# Patient Record
Sex: Female | Born: 1995 | Race: Black or African American | Hispanic: No | Marital: Single | Smoking: Never smoker
Health system: Southern US, Community
[De-identification: ages and names within clinical notes are randomized; demographics above are authoritative.]

## PROBLEM LIST (undated history)

## (undated) DIAGNOSIS — J45909 Unspecified asthma, uncomplicated: Secondary | ICD-10-CM

## (undated) DIAGNOSIS — IMO0002 Reserved for concepts with insufficient information to code with codable children: Secondary | ICD-10-CM

## (undated) DIAGNOSIS — I10 Essential (primary) hypertension: Secondary | ICD-10-CM

## (undated) DIAGNOSIS — N189 Chronic kidney disease, unspecified: Secondary | ICD-10-CM

## (undated) DIAGNOSIS — M329 Systemic lupus erythematosus, unspecified: Secondary | ICD-10-CM

## (undated) HISTORY — DX: Essential (primary) hypertension: I10

## (undated) HISTORY — PX: OTHER SURGICAL HISTORY: SHX169

## (undated) HISTORY — DX: Reserved for concepts with insufficient information to code with codable children: IMO0002

## (undated) HISTORY — DX: Systemic lupus erythematosus, unspecified: M32.9

## (undated) HISTORY — DX: Unspecified asthma, uncomplicated: J45.909

## (undated) HISTORY — DX: Chronic kidney disease, unspecified: N18.9

---
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
## 2017-03-09 HISTORY — PX: OTHER SURGICAL HISTORY: SHX169

## 2018-03-09 HISTORY — PX: KIDNEY TRANSPLANT: SHX239

## 2019-02-16 ENCOUNTER — Ambulatory Visit: Payer: Self-pay | Admitting: Family Medicine

## 2019-03-22 ENCOUNTER — Other Ambulatory Visit: Payer: Self-pay

## 2019-03-23 ENCOUNTER — Ambulatory Visit (INDEPENDENT_AMBULATORY_CARE_PROVIDER_SITE_OTHER): Payer: Medicare Other | Admitting: Family Medicine

## 2019-03-23 ENCOUNTER — Encounter: Payer: Self-pay | Admitting: Family Medicine

## 2019-03-23 VITALS — BP 136/94 | HR 88 | Temp 97.8°F | Wt 185.0 lb

## 2019-03-23 DIAGNOSIS — Z96643 Presence of artificial hip joint, bilateral: Secondary | ICD-10-CM

## 2019-03-23 DIAGNOSIS — N939 Abnormal uterine and vaginal bleeding, unspecified: Secondary | ICD-10-CM

## 2019-03-23 DIAGNOSIS — I151 Hypertension secondary to other renal disorders: Secondary | ICD-10-CM

## 2019-03-23 DIAGNOSIS — Z7689 Persons encountering health services in other specified circumstances: Secondary | ICD-10-CM

## 2019-03-23 DIAGNOSIS — J4520 Mild intermittent asthma, uncomplicated: Secondary | ICD-10-CM

## 2019-03-23 DIAGNOSIS — Z8739 Personal history of other diseases of the musculoskeletal system and connective tissue: Secondary | ICD-10-CM

## 2019-03-23 DIAGNOSIS — N2889 Other specified disorders of kidney and ureter: Secondary | ICD-10-CM

## 2019-03-23 DIAGNOSIS — M329 Systemic lupus erythematosus, unspecified: Secondary | ICD-10-CM | POA: Diagnosis not present

## 2019-03-23 DIAGNOSIS — Z940 Kidney transplant status: Secondary | ICD-10-CM

## 2019-03-23 NOTE — Patient Instructions (Signed)
Abnormal Uterine Bleeding Abnormal uterine bleeding is unusual bleeding from the uterus. It includes:  Bleeding or spotting between periods.  Bleeding after sex.  Bleeding that is heavier than normal.  Periods that last longer than usual.  Bleeding after menopause. Abnormal uterine bleeding can affect women at various stages in life, including teenagers, women in their reproductive years, pregnant women, and women who have reached menopause. Common causes of abnormal uterine bleeding include:  Pregnancy.  Growths of tissue (polyps).  A noncancerous tumor in the uterus (fibroid).  Infection.  Cancer.  Hormonal imbalances. Any type of abnormal bleeding should be evaluated by a health care provider. Many cases are minor and simple to treat, while others are more serious. Treatment will depend on the cause of the bleeding. Follow these instructions at home:  Monitor your condition for any changes.  Do not use tampons, douche, or have sex if told by your health care provider.  Change your pads often.  Get regular exams that include pelvic exams and cervical cancer screening.  Keep all follow-up visits as told by your health care provider. This is important. Contact a health care provider if:  Your bleeding lasts for more than one week.  You feel dizzy at times.  You feel nauseous or you vomit. Get help right away if:  You pass out.  Your bleeding soaks through a pad every hour.  You have abdominal pain.  You have a fever.  You become sweaty or weak.  You pass large blood clots from your vagina. Summary  Abnormal uterine bleeding is unusual bleeding from the uterus.  Any type of abnormal bleeding should be evaluated by a health care provider. Many cases are minor and simple to treat, while others are more serious.  Treatment will depend on the cause of the bleeding. This information is not intended to replace advice given to you by your health care provider.  Make sure you discuss any questions you have with your health care provider. Document Revised: 06/02/2017 Document Reviewed: 03/27/2016 Elsevier Patient Education  2020 Elsevier Inc.  Systemic Lupus Erythematosus, Adult Systemic lupus erythematosus (SLE) is a long-term (chronic) disease that can affect many parts of the body. SLE is an autoimmune disease. With this type of disease, the body's defense system (immune system) mistakenly attacks healthy tissues. This can cause damage to the skin, joints, blood vessels, brain, kidneys, lungs, heart, and other internal organs. It causes pain, irritation, and inflammation. What are the causes? The cause of this condition is not known. What increases the risk? The following factors may make you more likely to develop this condition:  Being female.  Being of Asian, Hispanic, or African-American descent.  Having a family history of the condition.  Being exposed to tobacco smoke or smoking cigarettes.  Having an infection with a virus, such as Epstein-Barr virus.  Having a history of exposure to silica dust, metals, chemicals, mold or mildew, or insecticides.  Using oral contraceptives or hormone replacement therapy. What are the signs or symptoms? This condition can affect almost any organ or system in the body. Symptoms of the condition depend on which organ or system is affected. The most common symptoms include:  Fever.  Fatigue.  Weight loss.  Muscle aches.  Joint pain.  Skin rashes, especially over the nose and cheeks (butterfly rash) and after sun exposure. Symptoms can come and go. A period of time when symptoms get worse or come back is called a flare. A period of time with no  symptoms is called a remission. How is this diagnosed? This condition is diagnosed based on:  Your symptoms.  Your medical history.  A physical exam. You may also have tests, including:  Blood tests.  Urine tests.  A chest X-ray. You may be  referred to an autoimmune disease specialist (rheumatologist). How is this treated? There is no cure for this condition, but treatment can help to control symptoms, prevent flares (keep symptoms in remission), and prevent damage to the heart, lungs, kidneys, and other organs. Treatment will depend on what symptoms you are having and what organs or systems are affected. Treatment may involve taking a combination of medicines over time. Common medicines used to treat this condition include:  Antimalarial medicines to control symptoms, prevent flares, and protect against organ damage.  Corticosteroids and NSAIDs to reduce inflammation.  Medicines to weaken your immune system (immunosuppressants).  Biologic response modifiers to reduce inflammation and damage. Follow these instructions at home: Eating and drinking  Eat a heart-healthy diet. This may include: ? Eating high-fiber foods, such as fresh fruits and vegetables, whole grains, and beans. ? Eating heart-healthy fats (omega-3 fats), such as fish, flaxseed, and flaxseed oil. ? Limiting foods that are high in saturated fat and cholesterol, such as processed and fried foods, fatty meat, and full-fat dairy. ? Limiting how much salt (sodium) you eat.  Include calcium and vitamin D in your diet. Good sources of calcium and vitamin D include: ? Low-fat dairy products such as milk, yogurt, and cheese. ? Certain fish, such as fresh or canned salmon, tuna, and sardines. ? Products that have calcium and vitamin D added to them (fortified products), such as fortified cereals or juice. Medicines  Take over-the-counter and prescription medicines only as told by your health care provider.  Do not take any medicines that contain estrogen without first checking with your health care provider. Estrogen can trigger flares and may increase your risk for blood clots. Lifestyle      Stay active, as directed by your health care provider.  Do not use  any products that contain nicotine or tobacco, such as cigarettes and e-cigarettes. If you need help quitting, ask your health care provider.  Protect your skin from the sun by applying sunblock and wearing protective hats and clothing.  Learn as much as you can about your condition and have a good support system in place. Support may come from family, friends, or a lupus support group. General instructions  Work closely with all of your health care providers to manage your condition.  Stay up to date on all vaccines as directed by your health care provider.  Keep all follow-up visits as told by your health care provider. This is important. Contact a health care provider if:  You have a fever.  Your symptoms flare.  You develop new symptoms.  You have bloody, foamy, or coffee-colored urine.  There are changes in your urination. For example, you urinate more often at night.  You think that you may be depressed or have anxiety.  You become pregnant or plan to become pregnant. Pregnancy in women with this condition is considered high risk. Get help right away if:  You have chest pain.  You have trouble breathing.  You have a seizure.  You suddenly get a very bad headache.  You suddenly develop facial or body weakness.  You cannot speak.  You cannot understand speech. These symptoms may represent a serious problem that is an emergency. Do not wait to  see if the symptoms will go away. Get medical help right away. Call your local emergency services (911 in the [place] Do not drive yourself to the hospital. Summary  Systemic lupus erythematosus (SLE) is a long-term disease that can affect many parts of the body.  SLE is an autoimmune disease. That means your body's defense system (immune system) mistakenly attacks healthy tissues.  There is no cure for this condition, but treatment can help to control symptoms, prevent flares, and prevent damage to your organs. Treatment may  involve taking a combination of medicines over time. This information is not intended to replace advice given to you by your health care provider. Make sure you discuss any questions you have with your health care provider. Document Revised: 04/08/2017 Document Reviewed: 04/02/2017 Elsevier Patient Education  2020 [employer].

## 2019-03-27 ENCOUNTER — Encounter: Payer: Self-pay | Admitting: Family Medicine

## 2019-03-27 DIAGNOSIS — N939 Abnormal uterine and vaginal bleeding, unspecified: Secondary | ICD-10-CM | POA: Insufficient documentation

## 2019-03-27 DIAGNOSIS — M3214 Glomerular disease in systemic lupus erythematosus: Secondary | ICD-10-CM | POA: Insufficient documentation

## 2019-03-27 DIAGNOSIS — M329 Systemic lupus erythematosus, unspecified: Secondary | ICD-10-CM | POA: Insufficient documentation

## 2019-03-27 DIAGNOSIS — Z940 Kidney transplant status: Secondary | ICD-10-CM | POA: Insufficient documentation

## 2019-03-27 DIAGNOSIS — Z8739 Personal history of other diseases of the musculoskeletal system and connective tissue: Secondary | ICD-10-CM | POA: Insufficient documentation

## 2019-03-27 DIAGNOSIS — J4520 Mild intermittent asthma, uncomplicated: Secondary | ICD-10-CM | POA: Insufficient documentation

## 2019-03-27 DIAGNOSIS — I151 Hypertension secondary to other renal disorders: Secondary | ICD-10-CM | POA: Insufficient documentation

## 2019-03-27 DIAGNOSIS — Z96643 Presence of artificial hip joint, bilateral: Secondary | ICD-10-CM | POA: Insufficient documentation

## 2019-03-27 NOTE — Progress Notes (Signed)
Patient presents to clinic today to f/u on chronic issues and establish care.  SUBJECTIVE: PMH: Pt is a 24 yo female with pmh sig for h/o kidney failure, lupus nephritis s/p renal txp, asthma, HTN, h/o pericarditis, and h/o AVN s/p b/l THR.  Pt previously seen in [place] at [facility].  H/o Lupus s/p renal txp: -dx'd with Lupus, June 2013 -had proteinuria at the time -s/p renal biposy x 2. -h/o renal failure -was on PD -received txp 05/10/2018 in [place] -currently on mycophenolate, prednisone, tacrolimus, Bactrim on M, W, F, vitamin D, MVI with iron -Labs ordered last wk by Nephrologist in [place], Dr. Laney Pastor.  Done at [facility]  Per pt LFTs were elevated. -Drinking 1-2 gallons of water per day -Needs a local Nephrologist.  Requesting referral to [facility] in [place] as they have a txp center.  AUB: -Menses irregular s/p txp -LMP 6 weeks ago -Only able to use progesterone only OCPs H/o menses x 85 d while taking. -Prior to moving from [place], seen by OB/Gyn.  Needed transvaginal u/s to r/u polyp. -also states was supposed to have a biopsy in February. -needs local OB/Gyn.  Asthma: -Denies recent symptoms  HTN: -2/2 renal disease -s/p txp  H/o Pericarditis: -per pt initial confusion regarding cause. -thought likely 2/2 strep pharyngitis -occurred after 1st THR  H/o AVN: -s/p b/l hip replacement -previously seen by Rheum out of state  Allergies:   Past surgical history: S/p Bilateral hip replacement Renal biopsy times 09 August 2011 in February 2017 PD cath placed x2 and removed x2 Renal transplant  Social history: Patient is single.  Pt is currently on disability.  Working part-time at [employer].  Patient denies alcohol, tobacco, drug use.  Health Maintenance: PAP --last year  Family medical history: Dad-HTN, renal cancer status post nephrectomy MGM-anxiety, HTN MGF and "men on mom's side of family" including uncles with h/o  depression and suicide.  Past Medical History:  Diagnosis Date  . Asthma   . Chronic kidney disease   . Hypertension   . Lupus [facility])     Past Surgical History:  Procedure Laterality Date  . HIP RELACEMENT  2019  . KIDNEY TRANSPLANT  2020  . PARATENIAL CATHETER    . RENAL BIOPSY Left 2013    Current Outpatient Medications on File Prior to Visit  Medication Sig Dispense Refill  . famotidine (PEPCID) 40 MG tablet Take 40 mg by mouth 2 (two) times daily.    . Iron-Folic Acid-Vit B12 (IRON FORMULA PO) Take by mouth daily.    . mycophenolate (CELLCEPT) 500 MG tablet Take 500 mg by mouth 2 (two) times daily.    . predniSONE (DELTASONE) 5 MG tablet Take 5 mg by mouth daily with breakfast.    . Sulfamethoxazole-Trimethoprim (BACTRIM PO) Take by mouth 3 (three) times a week.    . tacrolimus (PROGRAF) 5 MG capsule Take 5 mg by mouth 2 (two) times daily.    Marland Kitchen VITAMIN D PO Take 5,000 Units by mouth daily.     No current facility-administered medications on file prior to visit.    Allergies  Allergen Reactions  . Nickel     SKIN IRRITATION    History reviewed. No pertinent family history.  Social History   Socioeconomic History  . Marital status: Single    Spouse name: Not on file  . Number of children: Not on file  . Years of education: Not on file  .  Highest education level: Not on file  Occupational History  . Not on file  Tobacco Use  . Smoking status: Never Smoker  . Smokeless tobacco: Never Used  Substance and Sexual Activity  . Alcohol use: Yes    Comment: OCCASSIONAL  . Drug use: Never  . Sexual activity: Yes  Other Topics Concern  . Not on file  Social History Narrative  . Not on file   Social Determinants of Health   Financial Resource Strain:   . Difficulty of Paying Living Expenses: Not on file  Food Insecurity:   . Worried About Programme researcher, broadcasting/film/video in the Last Year: Not on file  . Ran Out of Food in the Last Year: Not on file  Transportation Needs:    . Lack of Transportation (Medical): Not on file  . Lack of Transportation (Non-Medical): Not on file  Physical Activity:   . Days of Exercise per Week: Not on file  . Minutes of Exercise per Session: Not on file  Stress:   . Feeling of Stress : Not on file  Social Connections:   . Frequency of Communication with Friends and Family: Not on file  . Frequency of Social Gatherings with Friends and Family: Not on file  . Attends Religious Services: Not on file  . Active Member of Clubs or Organizations: Not on file  . Attends Banker Meetings: Not on file  . Marital Status: Not on file  Intimate Partner Violence:   . Fear of Current or Ex-Partner: Not on file  . Emotionally Abused: Not on file  . Physically Abused: Not on file  . Sexually Abused: Not on file    ROS General: Denies fever, chills, night sweats, changes in weight, changes in appetite HEENT: Denies headaches, ear pain, changes in vision, rhinorrhea, sore throat CV: Denies CP, palpitations, SOB, orthopnea Pulm: Denies SOB, cough, wheezing GI: Denies abdominal pain, nausea, vomiting, diarrhea, constipation GU: Denies dysuria, hematuria, frequency, vaginal discharge  +AUB Msk: Denies muscle cramps, joint pains Neuro: Denies weakness, numbness, tingling Skin: Denies rashes, bruising Psych: Denies depression, anxiety, hallucinations  BP (!) 136/94 (BP Location: Left Arm, Patient Position: Sitting, Cuff Size: Large)   Pulse 88   Temp 97.8 F (36.6 C) (Temporal)   Wt 185 lb (83.9 kg)   LMP 03/30/2018 (Exact Date)   SpO2 98%   Physical Exam Gen. Pleasant, well developed, well-nourished, in NAD HEENT - [place]/AT, PERRL, EOMI, no scleral icterus, no nasal drainage, pharynx without erythema or exudate. Lungs: no use of accessory muscles, CTAB, no wheezes, rales or rhonchi Cardiovascular: RRR, No r/g/m, no peripheral edema.  No renal bruits noted. Abdomen: BS present, soft, nontender,nondistended.     Musculoskeletal: No deformities, moves all four extremities, no cyanosis or clubbing, normal tone Neuro:  A&Ox3, CN II-XII intact, normal gait Skin:  Warm, dry, intact, no lesions Psych: normal affect, mood appropriate  No results found for this or any previous visit (from the past 2160 hour(s)).  Assessment/Plan: Lupus (HCC)  -caused nephritis leading renal transplant. 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  No results available in chart. - Plan: Ambulatory referral to Nephrology  Hypertension secondary to other renal disorders -elevated likely 2/2 white coat HTN as meeting a new  provider. -discussed importance of bp control -not currently on meds -lifestyle modifications.  Monitor bp at home. -will continue to monitor  Mild intermittent asthma without complication -stable -not currently requiring meds -continue to monitor  Abnormal uterine bleeding (AUB)  -as pt needs possible biopsy will place referral to OB/Gyn - Plan: Ambulatory referral to Obstetrics / Gynecology, US Transvaginal Non-OB  Encounter to establish care -We reviewed the PMH, PSH, FH, SH, Meds and Allergies. -We provided refills for any medications we will prescribe as needed. -We addressed current concerns per orders and patient instructions. -We have asked for records for pertinent exams, studies, vaccines and notes from previous providers. -We have advised patient to follow up per instructions below.  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

## 2019-04-05 ENCOUNTER — Ambulatory Visit
Admission: RE | Admit: 2019-04-05 | Discharge: 2019-04-05 | Disposition: A | Discharge status: Home / Self Care | Payer: Medicare Other | Source: Ambulatory Visit | Attending: Family Medicine | Admitting: Family Medicine

## 2019-04-05 DIAGNOSIS — N939 Abnormal uterine and vaginal bleeding, unspecified: Secondary | ICD-10-CM

## 2019-04-07 ENCOUNTER — Telehealth: Payer: Self-pay | Admitting: Family Medicine

## 2019-04-07 NOTE — Telephone Encounter (Signed)
[facility] HIM Dept faxed request for medical records to [facility]  04/07/19  KLM

## 2019-04-18 ENCOUNTER — Other Ambulatory Visit: Payer: Self-pay

## 2019-04-19 ENCOUNTER — Encounter: Payer: Self-pay | Admitting: Obstetrics and Gynecology

## 2019-04-19 ENCOUNTER — Ambulatory Visit (INDEPENDENT_AMBULATORY_CARE_PROVIDER_SITE_OTHER): Payer: Medicare Other | Admitting: Obstetrics and Gynecology

## 2019-04-19 VITALS — BP 142/90 | Ht 63.0 in | Wt 185.0 lb

## 2019-04-19 DIAGNOSIS — N898 Other specified noninflammatory disorders of vagina: Secondary | ICD-10-CM

## 2019-04-19 DIAGNOSIS — Z114 Encounter for screening for human immunodeficiency virus [HIV]: Secondary | ICD-10-CM

## 2019-04-19 DIAGNOSIS — N926 Irregular menstruation, unspecified: Secondary | ICD-10-CM | POA: Diagnosis not present

## 2019-04-19 DIAGNOSIS — Z124 Encounter for screening for malignant neoplasm of cervix: Secondary | ICD-10-CM

## 2019-04-19 DIAGNOSIS — Z113 Encounter for screening for infections with a predominantly sexual mode of transmission: Secondary | ICD-10-CM

## 2019-04-19 DIAGNOSIS — Z1151 Encounter for screening for human papillomavirus (HPV): Secondary | ICD-10-CM

## 2019-04-19 LAB — WET PREP FOR TRICH, YEAST, CLUE

## 2019-04-19 LAB — PREGNANCY, URINE: Preg Test, Ur: NEGATIVE

## 2019-04-19 MED ORDER — NORETHINDRONE 0.35 MG PO TABS: 1.0000 | ORAL_TABLET | Freq: Every day | ORAL | 4 refills | Status: DC

## 2019-04-19 MED ORDER — METRONIDAZOLE 0.75 % VA GEL: 1.0000 | Freq: Every day | VAGINAL | 0 refills | Status: AC

## 2019-04-19 MED ORDER — METRONIDAZOLE 500 MG PO TABS: 500.0000 mg | ORAL_TABLET | Freq: Two times a day (BID) | ORAL | 0 refills | Status: DC

## 2019-04-19 NOTE — Progress Notes (Signed)
Wendy King  07/16/1995 [id number]  HPI The patient is a 24 y.o. G0P0000 who presents today for irregular menses.  She is new to the office, coming from [place].  She had a kidney transplant 11 months ago due to effects from lupus nephritis.  She indicates that in January 2020 her Pap smear was abnormal but says the follow-up biopsy was "normal"  (there are no Pap smear or surgical pathology results available in EMR or care everywhere to view). It appears that she has antiphospholipid antibodies present.  She indicates that she has had a lifelong history of irregular periods.  She was concerned because of development of menstrual irregularity particularly ongoing spotting around the time of her surgery and with her use of steroids.  She was therefore put on progestin only minipill for birth control to help try to regulate her menstrual cycle.  At first, she started to get periods that were very heavy in the first day or two, requiring her to use multiple changes of a super tampon and pad within an hour.  With continued use this heavy bleeding stopped happening but then she started to develop menstrual irregularity and spotting sometimes for up to a few weeks per month.  This has been frustrating for her.  She did stop taking the minipill to see what effect this would have but so far she has not resumed a normal menstrual pattern and continues to have the irregular bleeding.  Her primary care provider did order a pelvic ultrasound performed 04/05/2019 which was essentially normal.  She also notes increased vaginal discharge and has had a history of bacterial vaginosis in the past.  She also would like to have STD screening today.  She says she has not been sexually active recently.  Past medical history,surgical history, problem list, medications, allergies, family history and social history were all reviewed and documented as reviewed in the EPIC chart.  Current Outpatient Medications on  File Prior to Visit  Medication Sig Dispense Refill  . carvedilol (COREG) 25 MG tablet Take 25 mg by mouth 2 (two) times daily with a meal.    . famotidine (PEPCID) 40 MG tablet Take 40 mg by mouth 2 (two) times daily.    . Iron-Folic Acid-Vit B12 (IRON FORMULA PO) Take by mouth daily.    . mycophenolate (CELLCEPT) 500 MG tablet Take 500 mg by mouth 2 (two) times daily.    . predniSONE (DELTASONE) 5 MG tablet Take 5 mg by mouth daily with breakfast.    . Sulfamethoxazole-Trimethoprim (BACTRIM PO) Take by mouth 3 (three) times a week.    . tacrolimus (PROGRAF) 5 MG capsule Take 5 mg by mouth 2 (two) times daily.    Marland Kitchen VITAMIN D PO Take 5,000 Units by mouth daily.     No current facility-administered medications on file prior to visit.    ROS:  Feeling well. No dyspnea or chest pain on exertion.  No abdominal pain, change in bowel habits, black or bloody stools.  No urinary tract symptoms. GYN ROS: As described above in the HPI  Physical Exam  BP (!) 142/90   Ht 5\' 3"  (1.6 m)   Wt 185 lb (83.9 kg)   LMP 03/25/2019   BMI 32.77 kg/m   General: Pleasant female, no acute distress, alert and oriented PELVIC EXAM: VULVA: normal appearing vulva with no masses, tenderness or lesions,  VAGINA: normal appearing vagina with normal color, no lesions, white-green thick discharge no odor, CERVIX: posterior location,  normal appearing cervix without discharge or lesions, somewhat uncomfortable with speculum exam, UTERUS: anteverted uterus is normal size, shape, consistency and nontender, ADNEXA: normal adnexa in size, nontender and no masses  Negative urine pregnancy test Wet mount vaginal indicates positive clue cells, negative trichomonas and hyphae, moderate white blood cells, many bacteria, 12-20 epithelial cells/hpf  Caryn Bee present at the examination   Assessment 24 yo G0 with past medical history significant for lupus and is status post kidney transplant here with concerns of abnormal  uterine bleeding, bacterial vaginosis  Plan 1.  Abnormal uterine bleeding Normal pelvic ultrasound and normal exam today.  Negative pregnancy test.  2 mm endometrial thickness on the ultrasound, no need for endometrial biopsy at this point unless the irregular menstrual bleeding is ongoing and becomes heavy.  No evidence of endometrial polyp on ultrasound but this sometimes can be missed so if ongoing may need to consider sonohysterogram or hysteroscopy.  Sounds like she has a history of abnormal Pap smear possibly cervical dysplasia.  Pap smear is repeated today.  We will check prolactin and TSH.  We had a long discussion regarding expectations from different types of birth control to help address abnormal uterine bleeding.  Since she has a high thrombosis risk with antiphospholipid antibodies present, I would recommend avoiding contraception with estrogen.  With progestin only options, there is still a slight increased risk of thrombotic disease, and often times endometrial atrophy with continued use of progestin only options can lead to problems with irregular bleeding/spotting, and this is a very well known side effect of the minipill.  We discussed various progestin only options including continuing on the minipill, or trialing Depo-Provera, Nexplanon, or the hormonal IUDs such as [place] or Mirena.  Overall it would probably be safest for her to not be on any hormones, but with her AUB problem, the benefits outweigh the risks.  The safest of the hormonal options would probably be the Mirena IUD, which she initially was not very open to considering.  She would like to do some more research on the different contraceptive options offered today.  I did refill the minipill for the time being and she will let us know if she would like to further discuss the other options.  We discussed that unfortunately some of her other concerns tend to be common side effects with different types of contraception, including  breast changes/tenderness, bloating, weight gain, etc.  2.  Bacterial vaginosis. She indicates that she has had this before.  We discussed that with 4 documented recurrences in any given year, this would be considered a recurrent problem.  For now we will treat with MetroGel nightly for 5 nights and if this becomes a recurrent issue, we may need to consider suppressive therapy.  Other advice to reduce the risk of recurrent BV is given..  3.  History of abnormal Pap smear. I do not have records of her previous Pap smear or pathology results from the biopsy she says she had.  We went ahead and performed a Pap smear and HPV cotest today.  4.  Patient requested STD screening. Vaginal wet mount and GC/committee are obtained, blood test screenings for HIV, HB surface antigen, hepatitis C antibody, RPR are collected.  5.  Blood pressure mildly elevated today.  Especially with lupus, I recommend that she monitor her blood pressures and notify her primary care physician if there is any persistent elevation.  I encouraged the patient to let us know she has any questions regarding  her different contraceptive options and to return to the office for further discussion if she desires.  All of her questions were answered to her satisfaction today.   Theresia Majors MD 04/19/19

## 2019-04-19 NOTE — Patient Instructions (Addendum)
We will notify you of pap smear results and STD results Look up information on the websites for Mirena or Skyla IUD Other birth control options would include Depo Provera or Nexplanon I sent a prescription for the MetroGel to treat the bacterial vaginosis Metrorrhagia Metrorrhagia is bleeding from the uterus that happens irregularly but often. The bleeding generally happens between menstrual periods. Follow these instructions at home: Pay attention to any changes in your symptoms. Let your health care provider know about them. Follow these instructions to help with your condition: Eating and drinking   Eat well-balanced meals. Include foods that are high in iron, such as liver, meat, shellfish, green leafy vegetables, and eggs.  If you become constipated: ? Drink plenty of water. Drink enough to keep your urine pale yellow. ? Take over-the-counter or prescription medicines. ? Eat foods that are high in fiber, such as beans, whole grains, and fresh fruits and vegetables. ? Limit foods that are high in fat and processed sugars, such as fried or sweet foods. Medicines  Take over-the-counter and prescription medicines only as told by your health care provider.  Do not change medicines without talking with your health care provider.  Aspirin or medicines that contain aspirin may make the bleeding worse. Do not take these medicines: ? During your period. ? During the week before your period.  If you were prescribed iron pills, take them as told by your health care provider. Iron pills help to replace iron that your body loses because of this condition. Activity  If you need to change your sanitary pad or tampon more than one time every 2 hours: ? Lie in bed with your feet raised (elevated). ? Place a cold pack on your lower abdomen. ? Rest as much as possible until the bleeding stops or slows down. General instructions   For 2 months, write down: ? When your period starts. ? When  your period ends. ? When any abnormal bleeding occurs. ? What problems you notice.  Keep all follow-up visits as told by your health care provider. This is important. Contact a health care provider if:  You get light-headed or weak.  You have nausea and vomiting.  You cannot eat or drink without vomiting.  You feel dizzy or have diarrhea while you are taking medicine.  Have questions about birth control. Get help right away if:  You develop a fever or chills.  You need to change your sanitary pad or tampon more than one time per hour.  Your bleeding becomesheavy.  Your flow contains clots.  You develop pain in your abdomen.  You lose consciousness.  You develop a rash. Summary  Metrorrhagia is bleeding from the uterus that happens irregularly but often, usually between menstrual periods.  Pay attention to any changes in your symptoms. Let your health care provider know about them.  Eat well-balanced meals. Include foods that are high in iron, such as liver, meat, shellfish, green leafy vegetables, and eggs.  Get help right away if you develop a fever, you see clots in your blood, your bleeding becomes heavy, you develop a rash, or you lose consciousness. This information is not intended to replace advice given to you by your health care provider. Make sure you discuss any questions you have with your health care provider. Document Revised: 08/26/2017 Document Reviewed: 08/26/2017 Elsevier Patient Education  2020 [employer].

## 2019-04-20 LAB — HIV ANTIBODY (ROUTINE TESTING W REFLEX): HIV 1&2 Ab, 4th Generation: NONREACTIVE

## 2019-04-20 LAB — TSH: TSH: 1.32 mIU/L

## 2019-04-20 LAB — HEPATITIS C ANTIBODY
Hepatitis C Ab: NONREACTIVE
SIGNAL TO CUT-OFF: 0.03 (ref <1.00)

## 2019-04-20 LAB — PROLACTIN: Prolactin: 13.1 ng/mL

## 2019-04-20 LAB — HEPATITIS B SURFACE ANTIGEN: Hepatitis B Surface Ag: NONREACTIVE

## 2019-04-20 LAB — RPR: RPR Ser Ql: NONREACTIVE

## 2019-04-21 LAB — PAP IG, CT-NG NAA, HPV HIGH-RISK
C. trachomatis RNA, TMA: NOT DETECTED
HPV DNA High Risk: NOT DETECTED
N. gonorrhoeae RNA, TMA: NOT DETECTED

## 2019-05-30 ENCOUNTER — Telehealth: Payer: Self-pay | Admitting: Family Medicine

## 2019-05-30 ENCOUNTER — Other Ambulatory Visit: Payer: Self-pay

## 2019-05-30 DIAGNOSIS — M329 Systemic lupus erythematosus, unspecified: Secondary | ICD-10-CM

## 2019-05-30 NOTE — Telephone Encounter (Signed)
Pt is requesting a referral to Transplant Center at [facility].   Rheumatologist as well she has pain all over from her lupus.

## 2019-05-30 NOTE — Telephone Encounter (Signed)
Spoke with pt state that she has not heard from nephrology office, pt state that she will call [facility] for information regarding their transplant clinic, pt state that she will call the office with information on placing a referral to [facility] Transplant clinic. Pt is aware that a referral to Rheumatology has been placed and should get a phone call for scheduling.

## 2019-05-30 NOTE — Telephone Encounter (Signed)
Called pt left a detailed message advised to call office to give update on if pt ever got an appointment with the Nephrologist

## 2019-05-30 NOTE — Telephone Encounter (Signed)
Please advise if ok for referrals

## 2019-05-30 NOTE — Telephone Encounter (Signed)
Pt called back and advised that she did not get an appt with Nephrologist.

## 2019-05-30 NOTE — Telephone Encounter (Signed)
Did pt ever go to or hear back from referral to Transplant Nephrologist at [facility] as previously requested?  Ok for rheumatology referral.

## 2019-06-02 ENCOUNTER — Ambulatory Visit: Payer: Medicare Other | Attending: Internal Medicine

## 2019-06-02 DIAGNOSIS — Z23 Encounter for immunization: Secondary | ICD-10-CM

## 2019-06-02 NOTE — Progress Notes (Signed)
   Covid-19 Vaccination Clinic  Name:  Aly Seidenberg    MRN: [id number] DOB: 1996/02/25  06/02/2019  Ms. Breshears was observed post Covid-19 immunization for 15 minutes without incident. She was provided with Vaccine Information Sheet and instruction to access the V-Safe system.   Ms. Cwynar was instructed to call 911 with any severe reactions post vaccine: Marland Kitchen Difficulty breathing  . Swelling of face and throat  . A fast heartbeat  . A bad rash all over body  . Dizziness and weakness   Immunizations Administered    Name Date Dose VIS Date Route   Pfizer COVID-19 Vaccine 06/02/2019 12:56 PM 0.3 mL 02/17/2019 Intramuscular   Manufacturer: [employer], [employer]   Lot: [id number]   NDC: [address]-6

## 2019-06-05 ENCOUNTER — Other Ambulatory Visit: Payer: Self-pay | Admitting: Family Medicine

## 2019-06-05 DIAGNOSIS — M329 Systemic lupus erythematosus, unspecified: Secondary | ICD-10-CM

## 2019-06-05 DIAGNOSIS — Z940 Kidney transplant status: Secondary | ICD-10-CM

## 2019-06-05 NOTE — Telephone Encounter (Signed)
Referral to [facility] placed 

## 2019-06-06 NOTE — Telephone Encounter (Signed)
Noted  

## 2019-06-14 ENCOUNTER — Encounter: Payer: Self-pay | Admitting: Family Medicine

## 2019-06-27 ENCOUNTER — Ambulatory Visit: Payer: Medicare Other

## 2020-02-15 NOTE — Progress Notes (Signed)
Subjective:   Wendy King is a 24 y.o. female who presents for Medicare Annual (Subsequent) preventive examination.  Review of Systems    N/A  Cardiac Risk Factors include: hypertension     Objective:    Today's Vitals   02/16/20 1520  BP: (!) 148/108  Pulse: 77  Temp: 98.3 F (36.8 C)  TempSrc: Oral  SpO2: 98%  Weight: 198 lb (89.8 kg)  Height: 5\' 3"  (1.6 m)   Body mass index is 35.07 kg/m.  Advanced Directives 02/16/2020  Does Patient Have a Medical Advance Directive? No  Would patient like information on creating a medical advance directive? No - Patient declined    Current Medications (verified) Outpatient Encounter Medications as of 02/16/2020  Medication Sig  . carvedilol (COREG) 25 MG tablet Take 25 mg by mouth 2 (two) times daily with a meal.  . famotidine (PEPCID) 40 MG tablet Take 40 mg by mouth 2 (two) times daily.  . Iron-Folic Acid-Vit B12 (IRON FORMULA PO) Take by mouth daily.  . mycophenolate (CELLCEPT) 500 MG tablet Take 500 mg by mouth 2 (two) times daily.  . norethindrone (ORTHO MICRONOR) 0.35 MG tablet Take 1 tablet (0.35 mg total) by mouth daily.  . predniSONE (DELTASONE) 5 MG tablet Take 5 mg by mouth daily with breakfast.  . Sulfamethoxazole-Trimethoprim (BACTRIM PO) Take by mouth 3 (three) times a week.  . tacrolimus (PROGRAF) 5 MG capsule Take by mouth 2 (two) times daily.  14/12/2019 VITAMIN D PO Take 5,000 Units by mouth daily.   No facility-administered encounter medications on file as of 02/16/2020.    Allergies (verified) Sulfa antibiotics and Nickel   History: Past Medical History:  Diagnosis Date  . Asthma   . Chronic kidney disease   . Hypertension   . Lupus [facility])    Past Surgical History:  Procedure Laterality Date  . HIP RELACEMENT  2019  . KIDNEY TRANSPLANT  2020  . PARATENIAL CATHETER    . RENAL BIOPSY Left 2013   Family History  Problem Relation Age of Onset  . Other Mother        Drug abuse  . Kidney cancer  Father   . Other Father        Drug abuse  . Hypertension Maternal Grandmother    Social History   Socioeconomic History  . Marital status: Single    Spouse name: Not on file  . Number of children: Not on file  . Years of education: Not on file  . Highest education level: Not on file  Occupational History  . Not on file  Tobacco Use  . Smoking status: Never Smoker  . Smokeless tobacco: Never Used  Vaping Use  . Vaping Use: Never used  Substance and Sexual Activity  . Alcohol use: Yes    Comment: OCCASSIONAL  . Drug use: Never  . Sexual activity: Yes    Birth control/protection: Condom    Comment: 1st intercourse 24 yo-More than 5 partners  Other Topics Concern  . Not on file  Social History Narrative  . Not on file   Social Determinants of Health   Financial Resource Strain: Low Risk   . Difficulty of Paying Living Expenses: Not hard at all  Food Insecurity: No Food Insecurity  . Worried About 14 in the Last Year: Never true  . Ran Out of Food in the Last Year: Never true  Transportation Needs: No Transportation Needs  . Lack of Transportation (Medical): No  .  Lack of Transportation (Non-Medical): No  Physical Activity: Insufficiently Active  . Days of Exercise per Week: 4 days  . Minutes of Exercise per Session: 30 min  Stress: No Stress Concern Present  . Feeling of Stress : Not at all  Social Connections: Moderately Isolated  . Frequency of Communication with Friends and Family: More than three times a week  . Frequency of Social Gatherings with Friends and Family: More than three times a week  . Attends Religious Services: Never  . Active Member of Clubs or Organizations: Yes  . Attends Banker Meetings: Never  . Marital Status: Never married    Tobacco Counseling Counseling given: Not Answered   Clinical Intake:  Pre-visit preparation completed: Yes  Pain : No/denies pain     Nutritional Risks: Unintentional weight  gain (Medication side effects) Diabetes: No  How often do you need to have someone help you when you read instructions, pamphlets, or other written materials from your doctor or pharmacy?: 1 - Never What is the last grade level you completed in school?: 12th grade  Diabetic?No   Interpreter Needed?: No  Information entered by :: SCrews,LPN   Activities of Daily Living In your present state of health, do you have any difficulty performing the following activities: 02/16/2020  Hearing? N  Vision? N  Difficulty concentrating or making decisions? N  Walking or climbing stairs? N  Dressing or bathing? N  Doing errands, shopping? N  Preparing Food and eating ? N  Using the Toilet? N  In the past six months, have you accidently leaked urine? N  Do you have problems with loss of bowel control? N  Managing your Medications? N  Managing your Finances? N  Housekeeping or managing your Housekeeping? N  Some recent data might be hidden    Patient Care Team: Deeann Saint, MD as PCP - General (Family Medicine)  Indicate any recent Medical Services you may have received from other than [facility] providers in the past year (date may be approximate).     Assessment:   This is a routine wellness examination for Wendy King.  Hearing/Vision screen  Hearing Screening   125Hz  250Hz  500Hz  1000Hz  2000Hz  3000Hz  4000Hz  6000Hz  8000Hz   Right ear:           Left ear:           Vision Screening Comments: Patient states gets eyes checked yearly. Has sometimes has floater in left eye   Dietary issues and exercise activities discussed: Current Exercise Habits: Home exercise routine, Type of exercise: walking, Time (Minutes): 30, Frequency (Times/Week): 4, Weekly Exercise (Minutes/Week): 120, Intensity: Mild, Exercise limited by: None identified  Goals    . Exercise 150 min/wk Moderate Activity      Depression Screen PHQ 2/9 Scores 02/16/2020  PHQ - 2 Score 4  PHQ- 9 Score 7    Fall Risk Fall  Risk  02/16/2020  Falls in the past year? 0  Number falls in past yr: 0  Injury with Fall? 0  Risk for fall due to : No Fall Risks  Follow up Falls evaluation completed;Falls prevention discussed    FALL RISK PREVENTION PERTAINING TO THE HOME:  Any stairs in or around the home? No  If so, are there any without handrails? No  Home free of loose throw rugs in walkways, pet beds, electrical cords, etc? Yes  Adequate lighting in your home to reduce risk of falls? Yes   ASSISTIVE DEVICES UTILIZED TO PREVENT FALLS:  Life alert? No  Use of a cane, walker or w/c? No  Grab bars in the bathroom? No  Shower chair or bench in shower? No  Elevated toilet seat or a handicapped toilet? No   TIMED UP AND GO:  Was the test performed? Yes .  Length of time to ambulate 10 feet: 3 sec.   Gait steady and fast without use of assistive device  Cognitive Function:   Normal cognitive status assessed by direct observation by this Nurse Health Advisor. No abnormalities found.        Immunizations Immunization History  Administered Date(s) Administered  . Influenza-Unspecified 01/08/2020  . PFIZER SARS-COV-2 Vaccination 06/02/2019, 07/08/2019, 12/01/2019    TDAP status: Due, Education has been provided regarding the importance of this vaccine. Advised may receive this vaccine at local pharmacy or Health Dept. Aware to provide a copy of the vaccination record if obtained from local pharmacy or Health Dept. Verbalized acceptance and understanding.  Flu Vaccine status: Up to date  Pneumococcal vaccine status: Up to date  Covid-19 vaccine status: Completed vaccines  Qualifies for Shingles Vaccine? No   Zostavax completed No   Shingrix Completed?: No.    Education has been provided regarding the importance of this vaccine. Patient has been advised to call insurance company to determine out of pocket expense if they have not yet received this vaccine. Advised may also receive vaccine at local  pharmacy or Health Dept. Verbalized acceptance and understanding.  Screening Tests Health Maintenance  Topic Date Due  . TETANUS/TDAP  Never done  . COVID-19 Vaccine (4 - Booster for Pfizer series) 05/30/2020  . PAP-Cervical Cytology Screening  04/18/2022  . PAP SMEAR-Modifier  04/18/2022  . INFLUENZA VACCINE  Completed  . Hepatitis C Screening  Completed  . HIV Screening  Completed    Health Maintenance  Health Maintenance Due  Topic Date Due  . TETANUS/TDAP  Never done    Colorectal Cancer Screening: Not required until age 77  Mammogram Status: Not required until age 10  Bone Density Status: Not required until age 101  Lung Cancer Screening: (Low Dose CT Chest recommended if Age 23-80 years, 30 pack-year currently smoking OR have quit w/in 15years.) does not qualify.   Lung Cancer Screening Referral: N/A   Additional Screening:  Hepatitis C Screening: does not qualify   Vision Screening: Recommended annual ophthalmology exams for early detection of glaucoma and other disorders of the eye. Is the patient up to date with their annual eye exam?  Yes  Who is the provider or what is the name of the office in which the patient attends annual eye exams? Dr.Peter Dunn If pt is not established with a provider, would they like to be referred to a provider to establish care? No .   Dental Screening: Recommended annual dental exams for proper oral hygiene  Community Resource Referral / Chronic Care Management: CRR required this visit?  No   CCM required this visit?  No      Plan:     I have personally reviewed and noted the following in the patient's chart:   . Medical and social history . Use of alcohol, tobacco or illicit drugs  . Current medications and supplements . Functional ability and status . Nutritional status . Physical activity . Advanced directives . List of other physicians . Hospitalizations, surgeries, and ER visits in previous 12  months . Vitals . Screenings to include cognitive, depression, and falls . Referrals and appointments  In addition,  I have reviewed and discussed with patient certain preventive protocols, quality metrics, and best practice recommendations. A written personalized care plan for preventive services as well as general preventive health recommendations were provided to patient.     Theodora BlowShannon Oluwaseun Cremer, LPN   16/10/960412/12/2019   Nurse Notes: None

## 2020-02-16 ENCOUNTER — Ambulatory Visit (INDEPENDENT_AMBULATORY_CARE_PROVIDER_SITE_OTHER): Payer: Medicare Other

## 2020-02-16 ENCOUNTER — Ambulatory Visit (INDEPENDENT_AMBULATORY_CARE_PROVIDER_SITE_OTHER): Payer: Medicare Other | Admitting: Family Medicine

## 2020-02-16 ENCOUNTER — Other Ambulatory Visit: Payer: Self-pay

## 2020-02-16 ENCOUNTER — Encounter: Payer: Self-pay | Admitting: Family Medicine

## 2020-02-16 VITALS — BP 148/108 | HR 77 | Temp 98.3°F | Ht 63.0 in | Wt 198.0 lb

## 2020-02-16 VITALS — BP 148/104 | HR 77 | Temp 98.3°F | Wt 198.0 lb

## 2020-02-16 DIAGNOSIS — I151 Hypertension secondary to other renal disorders: Secondary | ICD-10-CM

## 2020-02-16 DIAGNOSIS — F321 Major depressive disorder, single episode, moderate: Secondary | ICD-10-CM

## 2020-02-16 DIAGNOSIS — Z0000 Encounter for general adult medical examination without abnormal findings: Secondary | ICD-10-CM | POA: Diagnosis not present

## 2020-02-16 DIAGNOSIS — F419 Anxiety disorder, unspecified: Secondary | ICD-10-CM | POA: Diagnosis not present

## 2020-02-16 DIAGNOSIS — M3214 Glomerular disease in systemic lupus erythematosus: Secondary | ICD-10-CM

## 2020-02-16 DIAGNOSIS — R5383 Other fatigue: Secondary | ICD-10-CM

## 2020-02-16 DIAGNOSIS — F4310 Post-traumatic stress disorder, unspecified: Secondary | ICD-10-CM

## 2020-02-16 DIAGNOSIS — Z940 Kidney transplant status: Secondary | ICD-10-CM

## 2020-02-16 DIAGNOSIS — R41840 Attention and concentration deficit: Secondary | ICD-10-CM | POA: Diagnosis not present

## 2020-02-16 DIAGNOSIS — N2889 Other specified disorders of kidney and ureter: Secondary | ICD-10-CM

## 2020-02-16 NOTE — Patient Instructions (Addendum)
Preventive Care 21-26 Years Old, Female Preventive care refers to visits with your health care provider and lifestyle choices that can promote health and wellness. This includes:  A yearly physical exam. This may also be called an annual well check.  Regular dental visits and eye exams.  Immunizations.  Screening for certain conditions.  Healthy lifestyle choices, such as eating a healthy diet, getting regular exercise, not using drugs or products that contain nicotine and tobacco, and limiting alcohol use. What can I expect for my preventive care visit? Physical exam Your health care provider will check your:  Height and weight. This may be used to calculate body mass index (BMI), which tells if you are at a healthy weight.  Heart rate and blood pressure.  Skin for abnormal spots. Counseling Your health care provider may ask you questions about your:  Alcohol, tobacco, and drug use.  Emotional well-being.  Home and relationship well-being.  Sexual activity.  Eating habits.  Work and work environment.  Method of birth control.  Menstrual cycle.  Pregnancy history. What immunizations do I need?  Influenza (flu) vaccine  This is recommended every year. Tetanus, diphtheria, and pertussis (Tdap) vaccine  You may need a Td booster every 10 years. Varicella (chickenpox) vaccine  You may need this if you have not been vaccinated. Human papillomavirus (HPV) vaccine  If recommended by your health care provider, you may need three doses over 6 months. Measles, mumps, and rubella (MMR) vaccine  You may need at least one dose of MMR. You may also need a second dose. Meningococcal conjugate (MenACWY) vaccine  One dose is recommended if you are age 19-21 years and a first-year college student living in a residence hall, or if you have one of several medical conditions. You may also need additional booster doses. Pneumococcal conjugate (PCV13) vaccine  You may need  this if you have certain conditions and were not previously vaccinated. Pneumococcal polysaccharide (PPSV23) vaccine  You may need one or two doses if you smoke cigarettes or if you have certain conditions. Hepatitis A vaccine  You may need this if you have certain conditions or if you travel or work in places where you may be exposed to hepatitis A. Hepatitis B vaccine  You may need this if you have certain conditions or if you travel or work in places where you may be exposed to hepatitis B. Haemophilus influenzae type b (Hib) vaccine  You may need this if you have certain conditions. You may receive vaccines as individual doses or as more than one vaccine together in one shot (combination vaccines). Talk with your health care provider about the risks and benefits of combination vaccines. What tests do I need?  Blood tests  Lipid and cholesterol levels. These may be checked every 5 years starting at age 20.  Hepatitis C test.  Hepatitis B test. Screening  Diabetes screening. This is done by checking your blood sugar (glucose) after you have not eaten for a while (fasting).  Sexually transmitted disease (STD) testing.  BRCA-related cancer screening. This may be done if you have a family history of breast, ovarian, tubal, or peritoneal cancers.  Pelvic exam and Pap test. This may be done every 3 years starting at age 21. Starting at age 30, this may be done every 5 years if you have a Pap test in combination with an HPV test. Talk with your health care provider about your test results, treatment options, and if necessary, the need for more tests.   Follow these instructions at home: Eating and drinking   Eat a diet that includes fresh fruits and vegetables, whole grains, lean protein, and low-fat dairy.  Take vitamin and mineral supplements as recommended by your health care provider.  Do not drink alcohol if: ? Your health care provider tells you not to drink. ? You are  pregnant, may be pregnant, or are planning to become pregnant.  If you drink alcohol: ? Limit how much you have to 0-1 drink a day. ? Be aware of how much alcohol is in your drink. In the [place] one drink equals one 12 oz bottle of beer (355 mL), one 5 oz glass of wine (148 mL), or one 1 oz glass of hard liquor (44 mL). Lifestyle  Take daily care of your teeth and gums.  Stay active. Exercise for at least 30 minutes on 5 or more days each week.  Do not use any products that contain nicotine or tobacco, such as cigarettes, e-cigarettes, and chewing tobacco. If you need help quitting, ask your health care provider.  If you are sexually active, practice safe sex. Use a condom or other form of birth control (contraception) in order to prevent pregnancy and STIs (sexually transmitted infections). If you plan to become pregnant, see your health care provider for a preconception visit. What's next?  Visit your health care provider once a year for a well check visit.  Ask your health care provider how often you should have your eyes and teeth checked.  Stay up to date on all vaccines. This information is not intended to replace advice given to you by your health care provider. Make sure you discuss any questions you have with your health care provider. Document Revised: 11/04/2017 Document Reviewed: 11/04/2017 Elsevier Patient Education  2020 [employer].

## 2020-02-16 NOTE — Patient Instructions (Signed)
Systemic Lupus Erythematosus, Adult  Systemic lupus erythematosus (SLE) is a long-term (chronic) disease that can affect many parts of the body. SLE is an autoimmune disease. With this type of disease, the body's defense system (immune system) mistakenly attacks healthy tissues. This can cause damage to the skin, joints, blood vessels, brain, kidneys, lungs, heart, and other internal organs. It causes pain, irritation, and inflammation.  What are the causes?  The cause of this condition is not known.  What increases the risk?  The following factors may make you more likely to develop this condition:  · Being female.  · Being of Asian, Hispanic, or African-American descent.  · Having a family history of the condition.  · Being exposed to tobacco smoke or smoking cigarettes.  · Having an infection with a virus, such as Epstein-Barr virus.  · Having a history of exposure to silica dust, metals, chemicals, mold or mildew, or insecticides.  · Using oral contraceptives or hormone replacement therapy.  What are the signs or symptoms?  This condition can affect almost any organ or system in the body. Symptoms of the condition depend on which organ or system is affected.  The most common symptoms include:  · Fever.  · Fatigue.  · Weight loss.  · Muscle aches.  · Joint pain.  · Skin rashes, especially over the nose and cheeks (butterfly rash) and after sun exposure.  Symptoms can come and go. A period of time when symptoms get worse or come back is called a flare. A period of time with no symptoms is called a remission.  How is this diagnosed?  This condition is diagnosed based on:  · Your symptoms.  · Your medical history.  · A physical exam.  You may also have tests, including:  · Blood tests.  · Urine tests.  · A chest X-ray.  You may be referred to an autoimmune disease specialist (rheumatologist).  How is this treated?  There is no cure for this condition, but treatment can help to control symptoms, prevent flares (keep  symptoms in remission), and prevent damage to the heart, lungs, kidneys, and other organs. Treatment will depend on what symptoms you are having and what organs or systems are affected. Treatment may involve taking a combination of medicines over time.  Common medicines used to treat this condition include:  · Antimalarial medicines to control symptoms, prevent flares, and protect against organ damage.  · Corticosteroids and NSAIDs to reduce inflammation.  · Medicines to weaken your immune system (immunosuppressants).  · Biologic response modifiers to reduce inflammation and damage.  Follow these instructions at home:  Eating and drinking  · Eat a heart-healthy diet. This may include:  ? Eating high-fiber foods, such as fresh fruits and vegetables, whole grains, and beans.  ? Eating heart-healthy fats (omega-3 fats), such as fish, flaxseed, and flaxseed oil.  ? Limiting foods that are high in saturated fat and cholesterol, such as processed and fried foods, fatty meat, and full-fat dairy.  ? Limiting how much salt (sodium) you eat.  · Include calcium and vitamin D in your diet. Good sources of calcium and vitamin D include:  ? Low-fat dairy products such as milk, yogurt, and cheese.  ? Certain fish, such as fresh or canned salmon, tuna, and sardines.  ? Products that have calcium and vitamin D added to them (fortified products), such as fortified cereals or juice.  Medicines  · Take over-the-counter and prescription medicines only as told by your health   that contain nicotine or tobacco, such as cigarettes and e-cigarettes. If you need help quitting, ask your health care provider.  Protect your skin from the sun by applying  sunblock and wearing protective hats and clothing.  Learn as much as you can about your condition and have a good support system in place. Support may come from family, friends, or a lupus support group. General instructions  Work closely with all of your health care providers to manage your condition.  Stay up to date on all vaccines as directed by your health care provider.  Keep all follow-up visits as told by your health care provider. This is important. Contact a health care provider if:  You have a fever.  Your symptoms flare.  You develop new symptoms.  You have bloody, foamy, or coffee-colored urine.  There are changes in your urination. For example, you urinate more often at night.  You think that you may be depressed or have anxiety.  You become pregnant or plan to become pregnant. Pregnancy in women with this condition is considered high risk. Get help right away if:  You have chest pain.  You have trouble breathing.  You have a seizure.  You suddenly get a very bad headache.  You suddenly develop facial or body weakness.  You cannot speak.  You cannot understand speech. These symptoms may represent a serious problem that is an emergency. Do not wait to see if the symptoms will go away. Get medical help right away. Call your local emergency services (911 in the [place] Do not drive yourself to the hospital. Summary  Systemic lupus erythematosus (SLE) is a long-term disease that can affect many parts of the body.  SLE is an autoimmune disease. That means your body's defense system (immune system) mistakenly attacks healthy tissues.  There is no cure for this condition, but treatment can help to control symptoms, prevent flares, and prevent damage to your organs. Treatment may involve taking a combination of medicines over time. This information is not intended to replace advice given to you by your health care provider. Make sure you discuss any questions you  have with your health care provider. Document Revised: 04/08/2017 Document Reviewed: 04/02/2017 Elsevier Patient Education  2020 [employer] Inc[employer]  Post-Traumatic Stress Disorder, Adult Post-traumatic stress disorder (PTSD) is a mental health disorder that can occur after a traumatic event, such as a threat to life, serious injury, or sexual violence. Sometimes, PTSD can occur in people who hear about trauma that occurs to a close family member or friend. PTSD can happen to anyone at any age. What are the causes? The condition may be caused by experiencing a traumatic event. What increases the risk? This condition is more likely to occur in:  Engineer, mining.  People who are in circumstances where their lives are threatened.  People who have been the victim of, or witness to, a traumatic event, such as: ? Domestic violence. ? Physical or sexual abuse. ? Rape. ? A terrorist act or gun violence. ? Natural disasters. ? Accidents involving serious injury. What are the signs or symptoms? PTSD symptoms may start soon after a frightening event or months or years later. Symptoms last at least one month and tend to disrupt relationships, work, and daily activities. Symptoms of PTSD can be grouped into several categories. Intrusive symptoms This is when you re-experience the physical and emotional sensations of the traumatic event through one or more of the following ways:  Having upsetting dreams.  Feeling fear, horror, intense sadness, or anger in response to a reminder of the trauma.  Having unwanted upsetting memories while awake.  Having physical reactions triggered by reminders of the trauma, such as increased heart rate, shortness of breath, sweating, and shaking.  Having flashbacks, or feeling like you are going through the event again. Avoidance symptoms This is when you avoid anything that reminds you of the trauma. Symptoms may also include:  Losing interest  or not participating in daily activities.  Feeling disconnected from or avoiding other people.  Isolating yourself. Increased arousal symptoms You may have physical or emotional reactions triggered by your environment. Symptoms may include:  Being easily startled.  Behaving in a careless or self-destructive way.  Becoming easily irritated.  Feeling worried and nervous.  Having trouble concentrating.  Yelling at or hitting other people or objects.  Having trouble sleeping. Negative mood and thoughts  Believing that you or others are bad.  Feeling fear, horror, anger, sadness, guilt, or shame regularly.  Not being able to remember certain parts of the traumatic event.  Blaming yourself or others for the trauma.  Being unable to experience positive emotions, such as happiness or love. How is this diagnosed? PTSD is diagnosed through an assessment by a mental health professional. Bonita Quin will be asked questions about your symptoms. How is this treated? Treatment for this condition may include any of the following or a combination:  Taking medicines to reduce PTSD symptoms.  Having counseling with a mental health professional or therapist who is experienced in treating PTSD.  Doing eye movement desensitization and reprocessing therapy (EMDR). This type of therapy occurs with a specialized therapist. If you have other mental health concerns, these conditions will also be treated. Follow these instructions at home: Lifestyle  Find a support group in your community. Groups are often available for [employer] veterans, trauma victims, and family members or caregivers.  Try to get 7-9 hours of sleep each night. To help with sleep: ? Keep your bedroom cool and dark. ? Do not eat a heavy meal within 1 hour of bedtime. ? Do not drink alcohol or caffeinated drinks before bed. ? Avoid screen time, such as television, computers, tablets, or mobile phones, before bed.  Do not use  illegal drugs.  Contact a local organization to find out if you are eligible for a service dog. Activity  Exercise regularly. Try to do at least 30 minutes of physical activity most days of the week.  Practice self-calming through: ? Breathing exercises. ? Meditation. ? Yoga. ? Listening to quiet music.  Do not isolate yourself. Make connections with other people.  Consider volunteering. Volunteering can help you feel more connected. Eating and drinking  Do not drink alcohol if: ? Your health care provider tells you not to drink. ? You are pregnant, may be pregnant, or are planning to become pregnant.  If you drink alcohol: ? Limit how much you use to:  0-1 drink a day for women.  0-2 drinks a day for men. ? Be aware of how much alcohol is in your drink. In the [place] one drink equals one 12 oz bottle of beer (355 mL), one 5 oz glass of wine (148 mL), or one 1 oz glass of hard liquor (44 mL). General instructions  Take steps to help yourself feel safer at home, such as by installing a security system.  Work with a health care provider or therapist to help manage your symptoms.  Take over-the-counter and prescription  medicines as told by your health care provider.  Let others know that you have PTSD and the things that may trigger symptoms. This can protect you and help them understand you better.  If your PTSD is affecting your marriage or family, seek help from a family therapist.  Make sure to let all of your health care providers know you have PTSD. This is especially important if you are having surgery or need to be admitted to the hospital.  Keep all follow-up visits as told by your health care provider. This is important. Contact a health care provider if:  Your symptoms do not get better.  You are feeling overwhelmed by your symptoms. Get help right away if:  You have thoughts of hurting yourself or others. If you ever feel like you may hurt yourself or  others, or have thoughts about taking your own life, get help right away. You can go to your nearest emergency department or call:  Your local emergency services (911 in the [place]  A suicide crisis helpline, such as the National Suicide Prevention Lifeline at 434-282-9673. This is open 24 hours a day. Summary  Post-traumatic stress disorder (PTSD) is a mental health disorder that can occur after a traumatic event.  Treatment for PTSD may include medicines, counseling, eye movement desensitization and reprocessing therapy (EMDR), or a combination of therapies.  Find a support group in your community.  Get help right away if you have thoughts of hurting yourself or others. This information is not intended to replace advice given to you by your health care provider. Make sure you discuss any questions you have with your health care provider. Document Revised: 05/05/2018 Document Reviewed: 05/05/2018 Elsevier Patient Education  2020 Else[employer] Inc.  Managing Anxiety, Adult After being diagnosed with an anxiety disorder, you may be relieved to know why you have felt or behaved a certain way. You may also feel overwhelmed about the treatment ahead and what it will mean for your life. With care and support, you can manage this condition and recover from it. How to manage lifestyle changes Managing stress and anxiety  Stress is your body's reaction to life changes and events, both good and bad. Most stress will last just a few hours, but stress can be ongoing and can lead to more than just stress. Although stress can play a major role in anxiety, it is not the same as anxiety. Stress is usually caused by something external, such as a deadline, test, or competition. Stress normally passes after the triggering event has ended.  Anxiety is caused by something internal, such as imagining a terrible outcome or worrying that something will go wrong that will devastate you. Anxiety often does not go away  even after the triggering event is over, and it can become long-term (chronic) worry. It is important to understand the differences between stress and anxiety and to manage your stress effectively so that it does not lead to an anxious response. Talk with your health care provider or a counselor to learn more about reducing anxiety and stress. He or she may suggest tension reduction techniques, such as:  Music therapy. This can include creating or listening to music that you enjoy and that inspires you.  Mindfulness-based meditation. This involves being aware of your normal breaths while not trying to control your breathing. It can be done while sitting or walking.  Centering prayer. This involves focusing on a word, phrase, or sacred image that means something to you and brings  you peace.  Deep breathing. To do this, expand your stomach and inhale slowly through your nose. Hold your breath for 3-5 seconds. Then exhale slowly, letting your stomach muscles relax.  Self-talk. This involves identifying thought patterns that lead to anxiety reactions and changing those patterns.  Muscle relaxation. This involves tensing muscles and then relaxing them. Choose a tension reduction technique that suits your lifestyle and personality. These techniques take time and practice. Set aside 5-15 minutes a day to do them. Therapists can offer counseling and training in these techniques. The training to help with anxiety may be covered by some insurance plans. Other things you can do to manage stress and anxiety include:  Keeping a stress/anxiety diary. This can help you learn what triggers your reaction and then learn ways to manage your response.  Thinking about how you react to certain situations. You may not be able to control everything, but you can control your response.  Making time for activities that help you relax and not feeling guilty about spending your time in this way.  Visual imagery and yoga  can help you stay calm and relax.  Medicines Medicines can help ease symptoms. Medicines for anxiety include:  Anti-anxiety drugs.  Antidepressants. Medicines are often used as a primary treatment for anxiety disorder. Medicines will be prescribed by a health care provider. When used together, medicines, psychotherapy, and tension reduction techniques may be the most effective treatment. Relationships Relationships can play a big part in helping you recover. Try to spend more time connecting with trusted friends and family members. Consider going to couples counseling, taking family education classes, or going to family therapy. Therapy can help you and others better understand your condition. How to recognize changes in your anxiety Everyone responds differently to treatment for anxiety. Recovery from anxiety happens when symptoms decrease and stop interfering with your daily activities at home or work. This may mean that you will start to:  Have better concentration and focus. Worry will interfere less in your daily thinking.  Sleep better.  Be less irritable.  Have more energy.  Have improved memory. It is important to recognize when your condition is getting worse. Contact your health care provider if your symptoms interfere with home or work and you feel like your condition is not improving. Follow these instructions at home: Activity  Exercise. Most adults should do the following: ? Exercise for at least 150 minutes each week. The exercise should increase your heart rate and make you sweat (moderate-intensity exercise). ? Strengthening exercises at least twice a week.  Get the right amount and quality of sleep. Most adults need 7-9 hours of sleep each night. Lifestyle   Eat a healthy diet that includes plenty of vegetables, fruits, whole grains, low-fat dairy products, and lean protein. Do not eat a lot of foods that are high in solid fats, added sugars, or salt.  Make  choices that simplify your life.  Do not use any products that contain nicotine or tobacco, such as cigarettes, e-cigarettes, and chewing tobacco. If you need help quitting, ask your health care provider.  Avoid caffeine, alcohol, and certain over-the-counter cold medicines. These may make you feel worse. Ask your pharmacist which medicines to avoid. General instructions  Take over-the-counter and prescription medicines only as told by your health care provider.  Keep all follow-up visits as told by your health care provider. This is important. Where to find support You can get help and support from these sources:  Self-help groups.  Online and [employer].  A trusted spiritual leader.  Couples counseling.  Family education classes.  Family therapy. Where to find more information You may find that joining a support group helps you deal with your anxiety. The following sources can help you locate counselors or support groups near you:  Mental Health [place]: www.mentalhealthamerica.net  Anxiety and Depression Association of [place] (ADAA): ProgramCam.de  [employer] on Mental Illness (NAMI): www.nami.org Contact a health care provider if you:  Have a hard time staying focused or finishing daily tasks.  Spend many hours a day feeling worried about everyday life.  Become exhausted by worry.  Start to have headaches, feel tense, or have nausea.  Urinate more than normal.  Have diarrhea. Get help right away if you have:  A racing heart and shortness of breath.  Thoughts of hurting yourself or others. If you ever feel like you may hurt yourself or others, or have thoughts about taking your own life, get help right away. You can go to your nearest emergency department or call:  Your local emergency services (911 in the [place]  A suicide crisis helpline, such as the National Suicide Prevention Lifeline at 906-462-7921. This is open 24 hours a  day. Summary  Taking steps to learn and use tension reduction techniques can help calm you and help prevent triggering an anxiety reaction.  When used together, medicines, psychotherapy, and tension reduction techniques may be the most effective treatment.  Family, friends, and partners can play a big part in helping you recover from an anxiety disorder. This information is not intended to replace advice given to you by your health care provider. Make sure you discuss any questions you have with your health care provider. Document Revised: 07/26/2018 Document Reviewed: 07/26/2018 Elsevier Patient Education  2020 Else[employer] Inc.  Living With Depression Everyone experiences occasional disappointment, sadness, and loss in their lives. When you are feeling down, blue, or sad for at least 2 weeks in a row, it may mean that you have depression. Depression can affect your thoughts and feelings, relationships, daily activities, and physical health. It is caused by changes in the way your brain functions. If you receive a diagnosis of depression, your health care provider will tell you which type of depression you have and what treatment options are available to you. If you are living with depression, there are ways to help you recover from it and also ways to prevent it from coming back. How to cope with lifestyle changes Coping with stress     Stress is your body's reaction to life changes and events, both good and bad. Stressful situations may include:  Getting married.  The death of a spouse.  Losing a job.  Retiring.  Having a baby. Stress can last just a few hours or it can be ongoing. Stress can play a major role in depression, so it is important to learn both how to cope with stress and how to think about it differently. Talk with your health care provider or a counselor if you would like to learn more about stress reduction. He or she may suggest some stress reduction techniques, such  as:  Music therapy. This can include creating music or listening to music. Choose music that you enjoy and that inspires you.  Mindfulness-based meditation. This kind of meditation can be done while sitting or walking. It involves being aware of your normal breaths, rather than trying to control your breathing.  Centering prayer. This is a kind of  meditation that involves focusing on a spiritual word or phrase. Choose a word, phrase, or sacred image that is meaningful to you and that brings you peace.  Deep breathing. To do this, expand your stomach and inhale slowly through your nose. Hold your breath for 3-5 seconds, then exhale slowly, allowing your stomach muscles to relax.  Muscle relaxation. This involves intentionally tensing muscles then relaxing them. Choose a stress reduction technique that fits your lifestyle and personality. Stress reduction techniques take time and practice to develop. Set aside 5-15 minutes a day to do them. Therapists can offer training in these techniques. The training may be covered by some insurance plans. Other things you can do to manage stress include:  Keeping a stress diary. This can help you learn what triggers your stress and ways to control your response.  Understanding what your limits are and saying no to requests or events that lead to a schedule that is too full.  Thinking about how you respond to certain situations. You may not be able to control everything, but you can control how you react.  Adding humor to your life by watching funny films or TV shows.  Making time for activities that help you relax and not feeling guilty about spending your time this way.  Medicines Your health care provider may suggest certain medicines if he or she feels that they will help improve your condition. Avoid using alcohol and other substances that may prevent your medicines from working properly (may interact). It is also important to:  Talk with your  pharmacist or health care provider about all the medicines that you take, their possible side effects, and what medicines are safe to take together.  Make it your goal to take part in all treatment decisions (shared decision-making). This includes giving input on the side effects of medicines. It is best if shared decision-making with your health care provider is part of your total treatment plan. If your health care provider prescribes a medicine, you may not notice the full benefits of it for 4-8 weeks. Most people who are treated for depression need to be on medicine for at least 6-12 months after they feel better. If you are taking medicines as part of your treatment, do not stop taking medicines without first talking to your health care provider. You may need to have the medicine slowly decreased (tapered) over time to decrease the risk of harmful side effects. Relationships Your health care provider may suggest family therapy along with individual therapy and drug therapy. While there may not be family problems that are causing you to feel depressed, it is still important to make sure your family learns as much as they can about your mental health. Having your family's support can help make your treatment successful. How to recognize changes in your condition Everyone has a different response to treatment for depression. Recovery from major depression happens when you have not had signs of major depression for two months. This may mean that you will start to:  Have more interest in doing activities.  Feel less hopeless than you did 2 months ago.  Have more energy.  Overeat less often, or have better or improving appetite.  Have better concentration. Your health care provider will work with you to decide the next steps in your recovery. It is also important to recognize when your condition is getting worse. Watch for these signs:  Having fatigue or low energy.  Eating too much or too  little.  Sleeping too much or too little.  Feeling restless, agitated, or hopeless.  Having trouble concentrating or making decisions.  Having unexplained physical complaints.  Feeling irritable, angry, or aggressive. Get help as soon as you or your family members notice these symptoms coming back. How to get support and help from others How to talk with friends and family members about your condition  Talking to friends and family members about your condition can provide you with one way to get support and guidance. Reach out to trusted friends or family members, explain your symptoms to them, and let them know that you are working with a health care provider to treat your depression. Financial resources Not all insurance plans cover mental health care, so it is important to check with your insurance carrier. If paying for co-pays or counseling services is a problem, search for a local or county mental health care center. They may be able to offer public mental health care services at low or no cost when you are not able to see a private health care provider. If you are taking medicine for depression, you may be able to get the generic form, which may be less expensive. Some makers of prescription medicines also offer help to patients who cannot afford the medicines they need. Follow these instructions at home:   Get the right amount and quality of sleep.  Cut down on using caffeine, tobacco, alcohol, and other potentially harmful substances.  Try to exercise, such as walking or lifting small weights.  Take over-the-counter and prescription medicines only as told by your health care provider.  Eat a healthy diet that includes plenty of vegetables, fruits, whole grains, low-fat dairy products, and lean protein. Do not eat a lot of foods that are high in solid fats, added sugars, or salt.  Keep all follow-up visits as told by your health care provider. This is important. Contact a  health care provider if:  You stop taking your antidepressant medicines, and you have any of these symptoms: ? Nausea. ? Headache. ? Feeling lightheaded. ? Chills and body aches. ? Not being able to sleep (insomnia).  You or your friends and family think your depression is getting worse. Get help right away if:  You have thoughts of hurting yourself or others. If you ever feel like you may hurt yourself or others, or have thoughts about taking your own life, get help right away. You can go to your nearest emergency department or call:  Your local emergency services (911 in the [place]  A suicide crisis helpline, such as the National Suicide Prevention Lifeline at 825-312-9167. This is open 24-hours a day. Summary  If you are living with depression, there are ways to help you recover from it and also ways to prevent it from coming back.  Work with your health care team to create a management plan that includes counseling, stress management techniques, and healthy lifestyle habits. This information is not intended to replace advice given to you by your health care provider. Make sure you discuss any questions you have with your health care provider. Document Revised: 06/17/2018 Document Reviewed: 01/27/2016 Elsevier Patient Education  2020 [employer].

## 2020-02-16 NOTE — Progress Notes (Signed)
Subjective:    Patient ID: Wendy King, female    DOB: February 24, 1996, 24 y.o.   MRN: [id number]  No chief complaint on file.   HPI Patient is a 24 yo female with pmh sig for asthma, lupus, CKD s/p renal txp, h/o b/l AVN s/p b/l THR who was seen today for f/u.   Pt notes decreased concentration x months.  Unable to complete task.  Having difficulty with sleep.  Tried melatonin and taking clonidine prn.  Notes increased anxiety and stress 2/2 death in the family.  Had an appt with [facility], Elmarie Shiley.  Interested in seeing someone at [facility] 2/2 experience at [facility].  Notes elevation in bp.  Has an appt with [facility] in a few wks.  Planning on moving to [place] with BF.  Interested in family planning.  Past Medical History:  Diagnosis Date   Asthma    Chronic kidney disease    Hypertension    Lupus ([place])     Allergies  Allergen Reactions   Nickel     SKIN IRRITATION    ROS General: Denies fever, chills, night sweats, changes in weight, changes in appetite +bp elevation, fatigue HEENT: Denies headaches, ear pain, changes in vision, rhinorrhea, sore throat CV: Denies CP, palpitations, SOB, orthopnea Pulm: Denies SOB, cough, wheezing GI: Denies abdominal pain, nausea, vomiting, diarrhea, constipation GU: Denies dysuria, hematuria, frequency, vaginal discharge Msk: Denies muscle cramps, joint pains Neuro: Denies weakness, numbness, tingling Skin: Denies rashes, bruising Psych: Denies hallucinations  +anxiety, insomnia, increased stress     Objective:    Blood pressure (!) 148/104, pulse 77, temperature 98.3 F (36.8 C), temperature source Oral, weight 198 lb (89.8 kg), SpO2 98 %.  Gen. Pleasant, well-nourished, in no distress, normal affect   HEENT: [place]/AT, face symmetric, conjunctiva clear, no scleral icterus, PERRLA, EOMI, nares patent without drainage Lungs: no accessory muscle use, CTAB, no wheezes or rales Cardiovascular: RRR, no m/r/g, no peripheral  edema Abdomen: BS present, soft, NT/ND, no hepatosplenomegaly. Musculoskeletal: No deformities, no cyanosis or clubbing, normal tone Neuro:  A&Ox3, CN II-XII intact, normal gait Skin:  Warm, no lesions/ rash   Wt Readings from Last 3 Encounters:  02/16/20 198 lb (89.8 kg)  04/19/19 185 lb (83.9 kg)  03/23/19 185 lb (83.9 kg)    Lab Results  Component Value Date   TSH 1.32 04/19/2019    Assessment/Plan:  Attention deficit -possible 2/2 anxiety, depression, thyroid dysfunction, increased stress -Discussed ways to reduce stress -Self-care encouraged -We will obtain labs ASRS v1.1 completed  Anxiety  -GAD 7 score 19 -discussed medication and counseling options -pt wishes to consider -given handout -given precautions - Plan: TSH, T4, free  PTSD (post-traumatic stress disorder) -2/2 health and previous surgeries -Advised to consider counseling -Discussed medication options -Patient to consider -We will continue to monitor  Depression, major, single episode, moderate (HCC) -PHQ-9 score 21 -Discussed counseling medication options -Patient to consider options -Given handout -Given precautions  - Plan: TSH, T4, free  Hypertension secondary to other renal disorders  -Elevated -Repeat BP 138/109 -We will obtain labs to assess renal function -Continue Coreg 25 mg twice daily -Given precautions -Patient encouraged to follow-up with nephrology - Plan: CMP with eGFR([facility]), Vitamin D, 25-hydroxy, CMP with eGFR([facility]), Vitamin D, 25-hydroxy  Renal transplant, status post  -We will obtain labs to assess renal function given elevation in BP -Continue current meds including tacrolimus 4 mg, prednisone, Bactrim, mycophenolate -continue f/u with nephrology -will look into setting up  appt with new transplant clinic - Plan: CMP with eGFR([facility]), CBC with Differential/Platelet, Vitamin D, 25-hydroxy, CMP with eGFR([facility]), CBC with Differential/Platelet, Vitamin D,  25-hydroxy  Systemic lupus erythematosus with glomerular disease, unspecified SLE type ([place]) -pt encouraged to keep f/u appt with [facility]  Fatigue, unspecified type  - Plan: Vitamin D, 25-hydroxy, Vitamin D, 25-hydroxy  F/u in 2-4 wks  Grier Mitts, MD

## 2020-02-17 LAB — COMPLETE METABOLIC PANEL WITH GFR
AG Ratio: 1.7 (calc) (ref 1.0–2.5)
ALT: 11 U/L (ref 6–29)
AST: 13 U/L (ref 10–30)
Albumin: 4.8 g/dL (ref 3.6–5.1)
Alkaline phosphatase (APISO): 58 U/L (ref 31–125)
BUN/Creatinine Ratio: 16 (calc) (ref 6–22)
BUN: 19 mg/dL (ref 7–25)
CO2: 22 mmol/L (ref 20–32)
Calcium: 9.7 mg/dL (ref 8.6–10.2)
Chloride: 106 mmol/L (ref 98–110)
Creat: 1.16 mg/dL — ABNORMAL HIGH (ref 0.50–1.10)
GFR, Est African American: 75 mL/min/{1.73_m2} (ref >=60)
GFR, Est Non African American: 65 mL/min/{1.73_m2} (ref >=60)
Globulin: 2.8 g/dL (calc) (ref 1.9–3.7)
Glucose, Bld: 88 mg/dL (ref 65–99)
Potassium: 5.5 mmol/L — ABNORMAL HIGH (ref 3.5–5.3)
Sodium: 137 mmol/L (ref 135–146)
Total Bilirubin: 0.3 mg/dL (ref 0.2–1.2)
Total Protein: 7.6 g/dL (ref 6.1–8.1)

## 2020-02-17 LAB — CBC WITH DIFFERENTIAL/PLATELET
Absolute Monocytes: 420 cells/uL (ref 200–950)
Basophils Absolute: 23 cells/uL (ref 0–200)
Basophils Relative: 0.3 %
Eosinophils Absolute: 8 cells/uL — ABNORMAL LOW (ref 15–500)
Eosinophils Relative: 0.1 %
HCT: 39.5 % (ref 35.0–45.0)
Hemoglobin: 13.3 g/dL (ref 11.7–15.5)
Lymphs Abs: 675 cells/uL — ABNORMAL LOW (ref 850–3900)
MCH: 27.1 pg (ref 27.0–33.0)
MCHC: 33.7 g/dL (ref 32.0–36.0)
MCV: 80.4 fL (ref 80.0–100.0)
MPV: 12.6 fL — ABNORMAL HIGH (ref 7.5–12.5)
Monocytes Relative: 5.6 %
Neutro Abs: 6375 cells/uL (ref 1500–7800)
Neutrophils Relative %: 85 %
Platelets: 285 10*3/uL (ref 140–400)
RBC: 4.91 10*6/uL (ref 3.80–5.10)
RDW: 12.7 % (ref 11.0–15.0)
Total Lymphocyte: 9 %
WBC: 7.5 10*3/uL (ref 3.8–10.8)

## 2020-02-17 LAB — T4, FREE: Free T4: 1.3 ng/dL (ref 0.8–1.8)

## 2020-02-17 LAB — TSH: TSH: 0.74 mIU/L

## 2020-02-17 LAB — VITAMIN D 25 HYDROXY (VIT D DEFICIENCY, FRACTURES): Vit D, 25-Hydroxy: 33 ng/mL (ref 30–100)

## 2020-02-23 ENCOUNTER — Ambulatory Visit (INDEPENDENT_AMBULATORY_CARE_PROVIDER_SITE_OTHER): Payer: Medicare Other | Admitting: Family Medicine

## 2020-02-23 ENCOUNTER — Other Ambulatory Visit: Payer: Self-pay

## 2020-02-23 ENCOUNTER — Encounter: Payer: Self-pay | Admitting: Family Medicine

## 2020-02-23 VITALS — BP 110/82 | HR 76 | Temp 98.5°F | Wt 198.4 lb

## 2020-02-23 DIAGNOSIS — F321 Major depressive disorder, single episode, moderate: Secondary | ICD-10-CM

## 2020-02-23 DIAGNOSIS — R030 Elevated blood-pressure reading, without diagnosis of hypertension: Secondary | ICD-10-CM

## 2020-02-23 DIAGNOSIS — Z940 Kidney transplant status: Secondary | ICD-10-CM | POA: Diagnosis not present

## 2020-02-23 DIAGNOSIS — F419 Anxiety disorder, unspecified: Secondary | ICD-10-CM

## 2020-02-23 MED ORDER — SERTRALINE HCL 25 MG PO TABS: 12.5000 mg | ORAL_TABLET | Freq: Every day | ORAL | 2 refills | Status: DC

## 2020-02-23 NOTE — Progress Notes (Signed)
Subjective:    Patient ID: Wendy King, female    DOB: [birth year], 24 y.o.   MRN: [id number]  No chief complaint on file.   HPI Patient was seen today for f/u.  Pt notes increased anxiety causing insomnia and elevated bp.  Pt inquires about having her tacrolimus level checked as it has been a while since this was done.  Patient interested in starting medication for anxiety symptoms.  Past Medical History:  Diagnosis Date  . Asthma   . Chronic kidney disease   . Hypertension   . Lupus (HCC)     Allergies  Allergen Reactions  . Sulfa Antibiotics Anxiety and Hives  . Nickel     SKIN IRRITATION    ROS General: Denies fever, chills, night sweats, changes in weight, changes in appetite  + insomnia, elevated blood pressure HEENT: Denies headaches, ear pain, changes in vision, rhinorrhea, sore throat CV: Denies CP, palpitations, SOB, orthopnea Pulm: Denies SOB, cough, wheezing GI: Denies abdominal pain, nausea, vomiting, diarrhea, constipation GU: Denies dysuria, hematuria, frequency, vaginal discharge Msk: Denies muscle cramps, joint pains Neuro: Denies weakness, numbness, tingling Skin: Denies rashes, bruising Psych: Denies depression, hallucinations + anxiety     Objective:    Blood pressure 110/82, pulse 76, temperature 98.5 F (36.9 C), temperature source Oral, weight 198 lb 6.4 oz (90 kg), SpO2 99 %.  Gen. Pleasant, well-nourished, in no distress, normal affect   HEENT: [place]/AT, face symmetric, conjunctiva clear, no scleral icterus, PERRLA, EOMI, nares patent without drainage Lungs: no accessory muscle use, CTAB, no wheezes or rales Cardiovascular: RRR, no m/r/g, no peripheral edema Musculoskeletal: No deformities, no cyanosis or clubbing, normal tone Neuro:  A&Ox3, CN II-XII intact, normal gait Skin:  Warm, no lesions/ rash  Wt Readings from Last 3 Encounters:  02/23/20 198 lb 6.4 oz (90 kg)  02/16/20 198 lb (89.8 kg)  02/16/20 198 lb (89.8 kg)    Lab  Results  Component Value Date   WBC 7.5 02/16/2020   HGB 13.3 02/16/2020   HCT 39.5 02/16/2020   PLT 285 02/16/2020   GLUCOSE 88 02/16/2020   ALT 11 02/16/2020   AST 13 02/16/2020   NA 137 02/16/2020   K 5.5 (H) 02/16/2020   CL 106 02/16/2020   CREATININE 1.16 (H) 02/16/2020   BUN 19 02/16/2020   CO2 22 02/16/2020   TSH 0.74 02/16/2020    Assessment/Plan:  Anxiety -GAD-7 score 17 this visit -Discussed medication and counseling options -Patient interested in starting medication.  We will start Zoloft 25 mg daily. - Plan: sertraline (ZOLOFT) 25 MG tablet  Depression, major, single episode, moderate (HCC) -PHQ-9 score 16 this visit -Discussed medication and counseling options -We will start Zoloft 25 mg daily - Plan: sertraline (ZOLOFT) 25 MG tablet  Renal transplant, status post -Monitor renal function given elevated BP -Continue follow-up with nephrology and transplant - Plan: Tacrolimus,Highly Sensitive,LC/MS/MS  Elevated blood pressure -BP controlled in office -Continue current meds including Coreg 25 mg twice daily -Continue monitoring BP at home and keeping a log to bring with you to clinic -Patient encouraged to bring home BP cuff to next visit  F/u in 4-6 weeks, sooner if needed  Abbe Amsterdam, MD

## 2020-02-23 NOTE — Patient Instructions (Addendum)
Sertraline tablets What is this medicine? SERTRALINE (SER tra leen) is used to treat depression. It may also be used to treat obsessive compulsive disorder, panic disorder, post-trauma stress, premenstrual dysphoric disorder (PMDD) or social anxiety. This medicine may be used for other purposes; ask your health care provider or pharmacist if you have questions. COMMON BRAND NAME(S): Zoloft What should I tell my health care provider before I take this medicine? They need to know if you have any of these conditions:  bleeding disorders  bipolar disorder or a family history of bipolar disorder  glaucoma  heart disease  high blood pressure  history of irregular heartbeat  history of low levels of calcium, magnesium, or potassium in the blood  if you often drink alcohol  liver disease  receiving electroconvulsive therapy  seizures  suicidal thoughts, plans, or attempt; a previous suicide attempt by you or a family member  take medicines that treat or prevent blood clots  thyroid disease  an unusual or allergic reaction to sertraline, other medicines, foods, dyes, or preservatives  pregnant or trying to get pregnant  breast-feeding How should I use this medicine? Take this medicine by mouth with a glass of water. Follow the directions on the prescription label. You can take it with or without food. Take your medicine at regular intervals. Do not take your medicine more often than directed. Do not stop taking this medicine suddenly except upon the advice of your doctor. Stopping this medicine too quickly may cause serious side effects or your condition may worsen. A special MedGuide will be given to you by the pharmacist with each prescription and refill. Be sure to read this information carefully each time. Talk to your pediatrician regarding the use of this medicine in children. While this drug may be prescribed for children as young as 7 years for selected conditions,  precautions do apply. Overdosage: If you think you have taken too much of this medicine contact a poison control center or emergency room at once. NOTE: This medicine is only for you. Do not share this medicine with others. What if I miss a dose? If you miss a dose, take it as soon as you can. If it is almost time for your next dose, take only that dose. Do not take double or extra doses. What may interact with this medicine? Do not take this medicine with any of the following medications:  cisapride  dronedarone  linezolid  MAOIs like Carbex, Eldepryl, Marplan, Nardil, and Parnate  methylene blue (injected into a vein)  pimozide  thioridazine This medicine may also interact with the following medications:  alcohol  amphetamines  aspirin and aspirin-like medicines  certain medicines for depression, anxiety, or psychotic disturbances  certain medicines for fungal infections like ketoconazole, fluconazole, posaconazole, and itraconazole  certain medicines for irregular heart beat like flecainide, quinidine, propafenone  certain medicines for migraine headaches like almotriptan, eletriptan, frovatriptan, naratriptan, rizatriptan, sumatriptan, zolmitriptan  certain medicines for sleep  certain medicines for seizures like carbamazepine, valproic acid, phenytoin  certain medicines that treat or prevent blood clots like warfarin, enoxaparin, dalteparin  cimetidine  digoxin  diuretics  fentanyl  isoniazid  lithium  NSAIDs, medicines for pain and inflammation, like ibuprofen or naproxen  other medicines that prolong the QT interval (cause an abnormal heart rhythm) like dofetilide  rasagiline  safinamide  supplements like St. John's wort, kava kava, valerian  tolbutamide  tramadol  tryptophan This list may not describe all possible interactions. Give your health care provider  a list of all the medicines, herbs, non-prescription drugs, or dietary supplements  you use. Also tell them if you smoke, drink alcohol, or use illegal drugs. Some items may interact with your medicine. What should I watch for while using this medicine? Tell your doctor if your symptoms do not get better or if they get worse. Visit your doctor or health care professional for regular checks on your progress. Because it may take several weeks to see the full effects of this medicine, it is important to continue your treatment as prescribed by your doctor. Patients and their families should watch out for new or worsening thoughts of suicide or depression. Also watch out for sudden changes in feelings such as feeling anxious, agitated, panicky, irritable, hostile, aggressive, impulsive, severely restless, overly excited and hyperactive, or not being able to sleep. If this happens, especially at the beginning of treatment or after a change in dose, call your health care professional. Dennis Bast may get drowsy or dizzy. Do not drive, use machinery, or do anything that needs mental alertness until you know how this medicine affects you. Do not stand or sit up quickly, especially if you are an older patient. This reduces the risk of dizzy or fainting spells. Alcohol may interfere with the effect of this medicine. Avoid alcoholic drinks. Your mouth may get dry. Chewing sugarless gum or sucking hard candy, and drinking plenty of water may help. Contact your doctor if the problem does not go away or is severe. What side effects may I notice from receiving this medicine? Side effects that you should report to your doctor or health care professional as soon as possible:  allergic reactions like skin rash, itching or hives, swelling of the face, lips, or tongue  anxious  black, tarry stools  changes in vision  confusion  elevated mood, decreased need for sleep, racing thoughts, impulsive behavior  eye pain  fast, irregular heartbeat  feeling faint or lightheaded, falls  feeling agitated,  angry, or irritable  hallucination, loss of contact with reality  loss of balance or coordination  loss of memory  painful or prolonged erections  restlessness, pacing, inability to keep still  seizures  stiff muscles  suicidal thoughts or other mood changes  trouble sleeping  unusual bleeding or bruising  unusually weak or tired  vomiting Side effects that usually do not require medical attention (report to your doctor or health care professional if they continue or are bothersome):  change in appetite or weight  change in sex drive or performance  diarrhea  increased sweating  indigestion, nausea  tremors This list may not describe all possible side effects. Call your doctor for medical advice about side effects. You may report side effects to FDA at 1-800-FDA-1088. Where should I keep my medicine? Keep out of the reach of children. Store at room temperature between 15 and 30 degrees C (59 and 86 degrees F). Throw away any unused medicine after the expiration date. NOTE: This sheet is a summary. It may not cover all possible information. If you have questions about this medicine, talk to your doctor, pharmacist, or health care provider.  2020 Elsevier/Gold Standard (2018-02-15 10:09:27)  Living With Depression Everyone experiences occasional disappointment, sadness, and loss in their lives. When you are feeling down, blue, or sad for at least 2 weeks in a row, it may mean that you have depression. Depression can affect your thoughts and feelings, relationships, daily activities, and physical health. It is caused by changes in the  way your brain functions. If you receive a diagnosis of depression, your health care provider will tell you which type of depression you have and what treatment options are available to you. If you are living with depression, there are ways to help you recover from it and also ways to prevent it from coming back. How to cope with lifestyle  changes Coping with stress     Stress is your body's reaction to life changes and events, both good and bad. Stressful situations may include:  Getting married.  The death of a spouse.  Losing a job.  Retiring.  Having a baby. Stress can last just a few hours or it can be ongoing. Stress can play a major role in depression, so it is important to learn both how to cope with stress and how to think about it differently. Talk with your health care provider or a counselor if you would like to learn more about stress reduction. He or she may suggest some stress reduction techniques, such as:  Music therapy. This can include creating music or listening to music. Choose music that you enjoy and that inspires you.  Mindfulness-based meditation. This kind of meditation can be done while sitting or walking. It involves being aware of your normal breaths, rather than trying to control your breathing.  Centering prayer. This is a kind of meditation that involves focusing on a spiritual word or phrase. Choose a word, phrase, or sacred image that is meaningful to you and that brings you peace.  Deep breathing. To do this, expand your stomach and inhale slowly through your nose. Hold your breath for 3-5 seconds, then exhale slowly, allowing your stomach muscles to relax.  Muscle relaxation. This involves intentionally tensing muscles then relaxing them. Choose a stress reduction technique that fits your lifestyle and personality. Stress reduction techniques take time and practice to develop. Set aside 5-15 minutes a day to do them. Therapists can offer training in these techniques. The training may be covered by some insurance plans. Other things you can do to manage stress include:  Keeping a stress diary. This can help you learn what triggers your stress and ways to control your response.  Understanding what your limits are and saying no to requests or events that lead to a schedule that is too  full.  Thinking about how you respond to certain situations. You may not be able to control everything, but you can control how you react.  Adding humor to your life by watching funny films or TV shows.  Making time for activities that help you relax and not feeling guilty about spending your time this way.  Medicines Your health care provider may suggest certain medicines if he or she feels that they will help improve your condition. Avoid using alcohol and other substances that may prevent your medicines from working properly (may interact). It is also important to:  Talk with your pharmacist or health care provider about all the medicines that you take, their possible side effects, and what medicines are safe to take together.  Make it your goal to take part in all treatment decisions (shared decision-making). This includes giving input on the side effects of medicines. It is best if shared decision-making with your health care provider is part of your total treatment plan. If your health care provider prescribes a medicine, you may not notice the full benefits of it for 4-8 weeks. Most people who are treated for depression need to be on medicine  for at least 6-12 months after they feel better. If you are taking medicines as part of your treatment, do not stop taking medicines without first talking to your health care provider. You may need to have the medicine slowly decreased (tapered) over time to decrease the risk of harmful side effects. Relationships Your health care provider may suggest family therapy along with individual therapy and drug therapy. While there may not be family problems that are causing you to feel depressed, it is still important to make sure your family learns as much as they can about your mental health. Having your family's support can help make your treatment successful. How to recognize changes in your condition Everyone has a different response to treatment for  depression. Recovery from major depression happens when you have not had signs of major depression for two months. This may mean that you will start to:  Have more interest in doing activities.  Feel less hopeless than you did 2 months ago.  Have more energy.  Overeat less often, or have better or improving appetite.  Have better concentration. Your health care provider will work with you to decide the next steps in your recovery. It is also important to recognize when your condition is getting worse. Watch for these signs:  Having fatigue or low energy.  Eating too much or too little.  Sleeping too much or too little.  Feeling restless, agitated, or hopeless.  Having trouble concentrating or making decisions.  Having unexplained physical complaints.  Feeling irritable, angry, or aggressive. Get help as soon as you or your family members notice these symptoms coming back. How to get support and help from others How to talk with friends and family members about your condition  Talking to friends and family members about your condition can provide you with one way to get support and guidance. Reach out to trusted friends or family members, explain your symptoms to them, and let them know that you are working with a health care provider to treat your depression. Financial resources Not all insurance plans cover mental health care, so it is important to check with your insurance carrier. If paying for co-pays or counseling services is a problem, search for a local or county mental health care center. They may be able to offer public mental health care services at low or no cost when you are not able to see a private health care provider. If you are taking medicine for depression, you may be able to get the generic form, which may be less expensive. Some makers of prescription medicines also offer help to patients who cannot afford the medicines they need. Follow these instructions at  home:   Get the right amount and quality of sleep.  Cut down on using caffeine, tobacco, alcohol, and other potentially harmful substances.  Try to exercise, such as walking or lifting small weights.  Take over-the-counter and prescription medicines only as told by your health care provider.  Eat a healthy diet that includes plenty of vegetables, fruits, whole grains, low-fat dairy products, and lean protein. Do not eat a lot of foods that are high in solid fats, added sugars, or salt.  Keep all follow-up visits as told by your health care provider. This is important. Contact a health care provider if:  You stop taking your antidepressant medicines, and you have any of these symptoms: ? Nausea. ? Headache. ? Feeling lightheaded. ? Chills and body aches. ? Not being able to sleep (insomnia).  You or your friends and family think your depression is getting worse. Get help right away if:  You have thoughts of hurting yourself or others. If you ever feel like you may hurt yourself or others, or have thoughts about taking your own life, get help right away. You can go to your nearest emergency department or call:  Your local emergency services (911 in the [place]  A suicide crisis helpline, such as the [place] at (930)729-3260. This is open 24-hours a day. Summary  If you are living with depression, there are ways to help you recover from it and also ways to prevent it from coming back.  Work with your health care team to create a management plan that includes counseling, stress management techniques, and healthy lifestyle habits. This information is not intended to replace advice given to you by your health care provider. Make sure you discuss any questions you have with your health care provider. Document Revised: 06/17/2018 Document Reviewed: 01/27/2016 Elsevier Patient Education  2020 [place] With Anxiety Anxiety  disorders are mental health conditions that cause overwhelming feelings of nervousness or worry. These feelings interfere with daily activities and relationships. Anxiety disorders include:  Generalized anxiety disorder (GAD).  Social anxiety.  Post-traumatic stress disorder (PTSD). When a person has an anxiety disorder, his or her condition can affect others around him or her, such as friends and family members. Friends and family can help by offering support and understanding. What do I need to know about this condition? Anxiety is the mental and physical experience of nervousness or worry that you might feel when you think about a stressful event. Occasional anxiety is normal, but a person with an anxiety disorder becomes preoccupied with this worry. He or she may know that the anxiety is not logical, but knowing this does not relieve the discomfort that he or she feels. Anxiety disorders cause a great deal of distress and prevent someone from having a normal daily life. Someone with an anxiety disorder may:  Experience anxiety that: ? May or may not have a specific trigger. ? Lasts for long periods of time. ? Causes physical problems over time. ? Is far more intense than normal anticipation. ? Occurs at unpredictable times.  Feel restless or edgy.  Get fatigued easily.  Have trouble focusing.  Have muscle tension.  Have trouble falling asleep or staying asleep.  Be irritable and occasionally have sudden expressions of strong feelings (outbursts).  Have worries that do not make sense to you. What do I need to know about the treatment options? Anxiety disorders are generally very treatable by mental health providers such as psychologists, psychiatrists, and clinical social workers. Treatment may include one or more of the following:  Psychotherapy, also called talk therapy or counseling. Types of psychotherapy that are used to treat anxiety include: ? Cognitive behavioral therapy  (CBT). This type of therapy teaches a person how to recognize unhealthy feelings, thoughts, and behaviors, and how to replace those feelings with positive thoughts and actions. ? Behavior therapy that trains a person to relax and self-soothe. This also involves gradually exposing the person to the cause of the anxiety (progressive exposure therapy). ? Biofeedback. This type of therapy focuses on trying to control certain body functions, like heart rate, to lessen the physical impact of anxiety. ? Mindfulness-based stress reduction training. This uses education, meditation, and yoga to help a person stay focused on the present instead of living in the past  or worrying about the future. ? Acceptance and commitment therapy (ACT). This helps a person to focus on acceptance, rather than trying to control every situation. ? Family therapy. This treatment helps family members to communicate and deal with conflict in healthy ways.  Medicine to treat anxiety and help to control certain emotions and behaviors.  Mind-body programs. These programs encourage the person with anxiety to be involved in his or her treatment and feel empowered. Mind-body programs may include mindfulness-based stress reduction training, yoga, or tai chi. How can I create a safe environment?  For certain types of anxiety, such as PTSD, you may want to: ? Remove alcohol and prescription medicines from your loved one's home, or limit the amount of these substances in the home. This can help to prevent your loved one from abusing alcohol and prescription medicines. ? Remove or lock up guns and other weapons. If you do not have a safe place to keep a gun, local law enforcement may store a gun for you.  Make a written crisis plan. Include important phone numbers, such as the local crisis intervention team. Make sure that: ? The person with anxiety knows about this plan and agrees with it. ? Everyone who has regular contact with the person  knows about the plan and knows what to do in an emergency. ? The written plan is easily accessible and can be quickly put into action. How should I care for myself? It is important to find ways to care for your body, mind, and well-being while supporting someone with anxiety.  Try to maintain your normal routines. This can help you remember that your life is about more than your loved one's condition.  Understand what your limits are. Say "no" to requests or events that lead to a schedule that is too busy.  Make time for activities that help you relax, and try to not feel guilty about taking time for yourself.  Spend time with friends and family.  Consider trying meditation and deep breathing exercises to lower your stress. Attend some mind-body classes by yourself or with your loved one.  Get plenty of sleep.  Exercise, even if it is just taking a short walk a few times a week.  If you are struggling emotionally with guilt, fear, or anger, consider working with a therapist. What are some signs that the condition is getting worse? Signs that your loved one's condition may be getting worse include:  Dramatic mood swings.  Staying away from activities that he or she used to enjoy.  Drinking more alcohol than normal.  Either seeming tearful or seeming to lack emotion.  Talking about "not feeling right."  Staying away from others (isolating himself or herself). Where to find support Talk about the condition Good communication is the key to supporting your friend or family member. Here are a few things to keep in mind:  Be careful about too much prodding. Try not to overdo reminders to an adult friend or family member about things like taking medicines. Ask how your loved one prefers that you help.  Ask questions and then listen to your loved one's response. Be available if your friend or family member wants to talk, but give your loved one space if he or she does not feel like  talking.  Never ignore comments about suicide, and do not try to avoid the subject of suicide. Talking about suicide will not make your loved one want to act on it. You or your loved  one can reach out 24 hours a day to get free, private support (on the phone or a live online chat) from a suicide crisis helpline, such as the National Suicide Prevention Lifeline at 256-723-0883.  Be encouraging and offer emotional support. This can help to lower stress. Even saying something simple to comfort your loved one may help.  If your loved one is open to it, go with him or her to visits with a counselor or health care provider. Get suggestions directly from your loved one's care providers about when to get help if you are concerned about behavior changes. Privacy laws limit how much a person's health care provider can share with you without your loved one's permission, but if you feel that a situation is an emergency, do not wait to call a health care provider or emergency services. Find support and resources  Consider joining self-help and support groups, not only for your friend or family member, but also for yourself. People in these peer and family support groups understand what you and your loved one are going through. They can help you feel a sense of hope and connect you with local resources to help you learn more. ? You may also consider family therapy. General support  Make an effort to learn all you can about your loved one's form of anxiety.  Include your loved one in activities. Invite him or her to go for walks and outings.  Help your loved one follow his or her treatment plan as directed by health care providers. This could mean driving him or her to therapy sessions or suggesting ways to cope with stress.  Remember that your support really matters. Social support is a huge benefit for someone who is coping with anxiety. Where to find more information A health care provider may be able to  recommend mental health resources that are available online or over the phone. You could start with:  Government sites such as the Substance Abuse and Mental Health Services Administration (SAMHSA): SkateOasis.com.pt  National mental health organizations such as the [employer] on Mental [employer]ness (NAMI): www.nami.org Get help right away if:  Your loved one expresses thoughts about harming himself or herself or others.  Your loved one's behavior becomes hard to predict (erratic).  Your loved one shows behavior that does not make sense with the current time, place, or circumstances. These behaviors may include seeing, feeling, tasting, or hearing things that are not real (hallucinations) or having flashbacks. If you ever feel like your loved one may hurt himself or herself or others, or may have thoughts about taking his or her own life, get help right away. You can go to your nearest emergency department or call:  Your local emergency services (911 in the [place]  A suicide crisis helpline, such as the National Suicide Prevention Lifeline at [phone]. This is open 24 hours a day. Summary  People with anxiety disorders experience overwhelming feelings of nervousness or worry. Friends and family can help by offering support and understanding.  Anxiety disorders are generally very treatable by mental health providers. They can be treated with psychotherapy (also known as talk therapy), behavior therapy, medicine, and mind-body programs.  Be compassionate and listen to your loved one. Be available if your friend or family member wants to talk, but give your loved one space if he or she does not feel like talking.  Find ways to care for your own body, mind, and well-being while supporting someone with anxiety. Try to  maintain your normal routines and make time to do things that you enjoy. This information is not intended to replace advice given to you by your health care provider. Make  sure you discuss any questions you have with your health care provider. Document Revised: 06/16/2018 Document Reviewed: 07/07/2016 Elsevier Patient Education  [employer].  Insomnia Insomnia is a sleep disorder that makes it difficult to fall asleep or stay asleep. Insomnia can cause fatigue, low energy, difficulty concentrating, mood swings, and poor performance at work or school. There are three different ways to classify insomnia:  Difficulty falling asleep.  Difficulty staying asleep.  Waking up too early in the morning. Any type of insomnia can be long-term (chronic) or short-term (acute). Both are common. Short-term insomnia usually lasts for three months or less. Chronic insomnia occurs at least three times a week for longer than three months. What are the causes? Insomnia may be caused by another condition, situation, or substance, such as:  Anxiety.  Certain medicines.  Gastroesophageal reflux disease (GERD) or other gastrointestinal conditions.  Asthma or other breathing conditions.  Restless legs syndrome, sleep apnea, or other sleep disorders.  Chronic pain.  Menopause.  Stroke.  Abuse of alcohol, tobacco, or illegal drugs.  Mental health conditions, such as depression.  Caffeine.  Neurological disorders, such as Alzheimer's disease.  An overactive thyroid (hyperthyroidism). Sometimes, the cause of insomnia may not be known. What increases the risk? Risk factors for insomnia include:  Gender. Women are affected more often than men.  Age. Insomnia is more common as you get older.  Stress.  Lack of exercise.  Irregular work schedule or working night shifts.  Traveling between different time zones.  Certain medical and mental health conditions. What are the signs or symptoms? If you have insomnia, the main symptom is having trouble falling asleep or having trouble staying asleep. This may lead to other symptoms, such as:  Feeling fatigued  or having low energy.  Feeling nervous about going to sleep.  Not feeling rested in the morning.  Having trouble concentrating.  Feeling irritable, anxious, or depressed. How is this diagnosed? This condition may be diagnosed based on:  Your symptoms and medical history. Your health care provider may ask about: ? Your sleep habits. ? Any medical conditions you have. ? Your mental health.  A physical exam. How is this treated? Treatment for insomnia depends on the cause. Treatment may focus on treating an underlying condition that is causing insomnia. Treatment may also include:  Medicines to help you sleep.  Counseling or therapy.  Lifestyle adjustments to help you sleep better. Follow these instructions at home: Eating and drinking   Limit or avoid alcohol, caffeinated beverages, and cigarettes, especially close to bedtime. These can disrupt your sleep.  Do not eat a large meal or eat spicy foods right before bedtime. This can lead to digestive discomfort that can make it hard for you to sleep. Sleep habits   Keep a sleep diary to help you and your health care provider figure out what could be causing your insomnia. Write down: ? When you sleep. ? When you wake up during the night. ? How well you sleep. ? How rested you feel the next day. ? Any side effects of medicines you are taking. ? What you eat and drink.  Make your bedroom a dark, comfortable place where it is easy to fall asleep. ? Put up shades or blackout curtains to block light from outside. ? Use a white  noise machine to block noise. ? Keep the temperature cool.  Limit screen use before bedtime. This includes: ? Watching TV. ? Using your smartphone, tablet, or computer.  Stick to a routine that includes going to bed and waking up at the same times every day and night. This can help you fall asleep faster. Consider making a quiet activity, such as reading, part of your nighttime routine.  Try to  avoid taking naps during the day so that you sleep better at night.  Get out of bed if you are still awake after 15 minutes of trying to sleep. Keep the lights down, but try reading or doing a quiet activity. When you feel sleepy, go back to bed. General instructions  Take over-the-counter and prescription medicines only as told by your health care provider.  Exercise regularly, as told by your health care provider. Avoid exercise starting several hours before bedtime.  Use relaxation techniques to manage stress. Ask your health care provider to suggest some techniques that may work well for you. These may include: ? Breathing exercises. ? Routines to release muscle tension. ? Visualizing peaceful scenes.  Make sure that you drive carefully. Avoid driving if you feel very sleepy.  Keep all follow-up visits as told by your health care provider. This is important. Contact a health care provider if:  You are tired throughout the day.  You have trouble in your daily routine due to sleepiness.  You continue to have sleep problems, or your sleep problems get worse. Get help right away if:  You have serious thoughts about hurting yourself or someone else. If you ever feel like you may hurt yourself or others, or have thoughts about taking your own life, get help right away. You can go to your nearest emergency department or call:  Your local emergency services (911 in the [place]  A suicide crisis helpline, such as the [facility] at (260)714-8341. This is open 24 hours a day. Summary  Insomnia is a sleep disorder that makes it difficult to fall asleep or stay asleep.  Insomnia can be long-term (chronic) or short-term (acute).  Treatment for insomnia depends on the cause. Treatment may focus on treating an underlying condition that is causing insomnia.  Keep a sleep diary to help you and your health care provider figure out what could be causing your  insomnia. This information is not intended to replace advice given to you by your health care provider. Make sure you discuss any questions you have with your health care provider. Document Revised: 02/05/2017 Document Reviewed: 12/03/2016 Elsevier Patient Education  2020 [employer].

## 2020-02-26 LAB — TACROLIMUS,HIGHLY SENSITIVE,LC/MS/MS: Tacrolimus Lvl: 6 mcg/L

## 2020-03-17 ENCOUNTER — Encounter: Payer: Self-pay | Admitting: Family Medicine

## 2020-03-17 DIAGNOSIS — F419 Anxiety disorder, unspecified: Secondary | ICD-10-CM | POA: Insufficient documentation

## 2020-03-17 DIAGNOSIS — F321 Major depressive disorder, single episode, moderate: Secondary | ICD-10-CM | POA: Insufficient documentation

## 2020-03-17 DIAGNOSIS — F4310 Post-traumatic stress disorder, unspecified: Secondary | ICD-10-CM | POA: Insufficient documentation

## 2020-03-27 ENCOUNTER — Telehealth: Payer: Self-pay | Admitting: Family Medicine

## 2020-03-28 ENCOUNTER — Encounter: Payer: Self-pay | Admitting: Family Medicine

## 2020-03-28 ENCOUNTER — Telehealth (INDEPENDENT_AMBULATORY_CARE_PROVIDER_SITE_OTHER): Payer: Medicare Other | Admitting: Family Medicine

## 2020-03-28 DIAGNOSIS — F419 Anxiety disorder, unspecified: Secondary | ICD-10-CM | POA: Diagnosis not present

## 2020-03-28 DIAGNOSIS — F320 Major depressive disorder, single episode, mild: Secondary | ICD-10-CM

## 2020-03-28 DIAGNOSIS — R110 Nausea: Secondary | ICD-10-CM | POA: Diagnosis not present

## 2020-03-28 MED ORDER — ONDANSETRON HCL 4 MG PO TABS: 4.0000 mg | ORAL_TABLET | Freq: Three times a day (TID) | ORAL | 0 refills | Status: AC | PRN

## 2020-03-28 NOTE — Progress Notes (Signed)
Virtual Visit via Video Note  I connected with Wendy King on 03/28/20 at  1:30 PM EST by a video enabled telemedicine application 2/2 COVID-19 pandemic and verified that I am speaking with the correct person using two identifiers.  Location patient: home Location provider:work or home office Persons participating in the virtual visit: patient, provider  I discussed the limitations of evaluation and management by telemedicine and the availability of in person appointments. The patient expressed understanding and agreed to proceed.   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  Pt notes increased nausea with Zoloft 25 mg.  She has been on the med x 3 wks.  Pt notes having diarrhea one night after taking the dose late.  Pt started school which is exciting.  She is a little frustrated with her job.    Pt is going to therapy weekly.  Her therapist is in [place].    Pt had her appt with [facility] yesterday.     Pt's bp was 128/70 something at her appt.  Her HR has been back to normal.   ROS: See pertinent positives and negatives per HPI.  Past Medical History:  Diagnosis Date  . Asthma   . Chronic kidney disease   . Hypertension   . Lupus [facility])     Past Surgical History:  Procedure Laterality Date  . HIP RELACEMENT  2019  . KIDNEY TRANSPLANT  2020  . PARATENIAL CATHETER    . RENAL BIOPSY Left 2013    Family History  Problem Relation Age of Onset  . Other Mother        Drug abuse  . Kidney cancer Father   . Other Father        Drug abuse  . Hypertension Maternal Grandmother       Current Outpatient Medications:  .  carvedilol (COREG) 25 MG tablet, Take 25 mg by mouth 2 (two) times daily with a meal., Disp: , Rfl:  .  famotidine (PEPCID) 40 MG tablet, Take 40 mg by mouth 2 (two) times daily., Disp: , Rfl:  .  Iron-Folic Acid-Vit B12  (IRON FORMULA PO), Take by mouth daily., Disp: , Rfl:  .  mycophenolate (CELLCEPT) 500 MG tablet, Take 500 mg by mouth 2 (two) times daily., Disp: , Rfl:  .  predniSONE (DELTASONE) 5 MG tablet, Take 5 mg by mouth daily with breakfast., Disp: , Rfl:  .  sertraline (ZOLOFT) 25 MG tablet, Take 0.5 tablets (12.5 mg total) by mouth daily., Disp: 30 tablet, Rfl: 2 .  Sulfamethoxazole-Trimethoprim (BACTRIM PO), Take by mouth 3 (three) times a week., Disp: , Rfl:  .  tacrolimus (PROGRAF) 5 MG capsule, Take by mouth 2 (two) times daily., Disp: , Rfl:  .  VITAMIN D PO, Take 5,000 Units by mouth daily., Disp: , Rfl:   EXAM:  VITALS per patient if applicable: RR between 12-20 bpm  GENERAL: alert, oriented, appears well and in no acute distress  HEENT: atraumatic, conjunctiva clear, no obvious abnormalities on inspection of external nose and ears  NECK: normal movements of the head and neck  LUNGS: on inspection no signs of respiratory distress, breathing rate appears normal, no obvious gross SOB, gasping or wheezing  CV: no obvious cyanosis  MS: moves all visible extremities without noticeable abnormality  PSYCH/NEURO: pleasant and cooperative, no obvious  depression or anxiety, speech and thought processing grossly intact  ASSESSMENT AND PLAN:  Discussed the following assessment and plan:  Nausea -Likely 2/2 Zoloft - Plan: ondansetron (ZOFRAN) 4 MG tablet  Anxiety -Continue therapy -Given nausea with Zoloft consider reducing dose to 12.5 mg vs discontinuing the medication for continued symptoms -Discussed the importance of self-care -We will continue to monitor  Depression, major, single episode, mild (HCC) -GAD-7 score 5.  Was 13 on 02/23/2020 -Continue therapy -Continue self-care including deep breathing exercises -Discussed various medication options other than Zoloft for continued issues with nausea -Given precautions -Follow-up in the next 2-4 weeks   I discussed the  assessment and treatment plan with the patient. The patient was provided an opportunity to ask questions and all were answered. The patient agreed with the plan and demonstrated an understanding of the instructions.   The patient was advised to call back or seek an in-person evaluation if the symptoms worsen or if the condition fails to improve as anticipated.  Deeann Saint, MD

## 2020-04-15 ENCOUNTER — Encounter: Payer: Self-pay | Admitting: Family Medicine

## 2020-04-15 ENCOUNTER — Other Ambulatory Visit: Payer: Self-pay | Admitting: Family Medicine

## 2020-04-15 DIAGNOSIS — F419 Anxiety disorder, unspecified: Secondary | ICD-10-CM

## 2020-04-15 DIAGNOSIS — F321 Major depressive disorder, single episode, moderate: Secondary | ICD-10-CM

## 2020-06-27 ENCOUNTER — Encounter: Payer: Self-pay | Admitting: Family Medicine

## 2020-06-27 ENCOUNTER — Ambulatory Visit (INDEPENDENT_AMBULATORY_CARE_PROVIDER_SITE_OTHER): Payer: Medicare Other | Admitting: Family Medicine

## 2020-06-27 ENCOUNTER — Other Ambulatory Visit: Payer: Self-pay

## 2020-06-27 VITALS — BP 128/96 | HR 79 | Temp 98.4°F | Wt 202.2 lb

## 2020-06-27 DIAGNOSIS — Z3041 Encounter for surveillance of contraceptive pills: Secondary | ICD-10-CM

## 2020-06-27 DIAGNOSIS — N2889 Other specified disorders of kidney and ureter: Secondary | ICD-10-CM | POA: Diagnosis not present

## 2020-06-27 DIAGNOSIS — I151 Hypertension secondary to other renal disorders: Secondary | ICD-10-CM | POA: Diagnosis not present

## 2020-06-27 DIAGNOSIS — M3214 Glomerular disease in systemic lupus erythematosus: Secondary | ICD-10-CM | POA: Diagnosis not present

## 2020-06-27 DIAGNOSIS — Z940 Kidney transplant status: Secondary | ICD-10-CM

## 2020-06-27 DIAGNOSIS — Z0000 Encounter for general adult medical examination without abnormal findings: Secondary | ICD-10-CM | POA: Diagnosis not present

## 2020-06-27 LAB — COMPREHENSIVE METABOLIC PANEL
ALT: 12 U/L (ref 0–35)
AST: 12 U/L (ref 0–37)
Albumin: 4.2 g/dL (ref 3.5–5.2)
Alkaline Phosphatase: 50 U/L (ref 39–117)
BUN: 15 mg/dL (ref 6–23)
CO2: 24 mEq/L (ref 19–32)
Calcium: 9.2 mg/dL (ref 8.4–10.5)
Chloride: 104 mEq/L (ref 96–112)
Creatinine, Ser: 1.24 mg/dL — ABNORMAL HIGH (ref 0.40–1.20)
GFR: 59.73 mL/min — ABNORMAL LOW (ref >60.00)
Glucose, Bld: 87 mg/dL (ref 70–99)
Potassium: 4.7 mEq/L (ref 3.5–5.1)
Sodium: 137 mEq/L (ref 135–145)
Total Bilirubin: 0.3 mg/dL (ref 0.2–1.2)
Total Protein: 7 g/dL (ref 6.0–8.3)

## 2020-06-27 LAB — LIPID PANEL
Cholesterol: 155 mg/dL (ref 0–200)
HDL: 56 mg/dL (ref >39.00)
LDL Cholesterol: 81 mg/dL (ref 0–99)
NonHDL: 98.96
Total CHOL/HDL Ratio: 3
Triglycerides: 92 mg/dL (ref 0.0–149.0)
VLDL: 18.4 mg/dL (ref 0.0–40.0)

## 2020-06-27 LAB — CBC WITH DIFFERENTIAL/PLATELET
Basophils Absolute: 0 10*3/uL (ref 0.0–0.1)
Basophils Relative: 0.5 % (ref 0.0–3.0)
Eosinophils Absolute: 0 10*3/uL (ref 0.0–0.7)
Eosinophils Relative: 0.7 % (ref 0.0–5.0)
HCT: 36.7 % (ref 36.0–46.0)
Hemoglobin: 11.8 g/dL — ABNORMAL LOW (ref 12.0–15.0)
Lymphocytes Relative: 18.4 % (ref 12.0–46.0)
Lymphs Abs: 1.2 10*3/uL (ref 0.7–4.0)
MCHC: 32 g/dL (ref 30.0–36.0)
MCV: 78.7 fl (ref 78.0–100.0)
Monocytes Absolute: 0.6 10*3/uL (ref 0.1–1.0)
Monocytes Relative: 9.6 % (ref 3.0–12.0)
Neutro Abs: 4.6 10*3/uL (ref 1.4–7.7)
Neutrophils Relative %: 70.8 % (ref 43.0–77.0)
Platelets: 259 10*3/uL (ref 150.0–400.0)
RBC: 4.67 Mil/uL (ref 3.87–5.11)
RDW: 14.1 % (ref 11.5–15.5)
WBC: 6.4 10*3/uL (ref 4.0–10.5)

## 2020-06-27 LAB — VITAMIN D 25 HYDROXY (VIT D DEFICIENCY, FRACTURES): VITD: 23.97 ng/mL — ABNORMAL LOW (ref 30.00–100.00)

## 2020-06-27 LAB — HEMOGLOBIN A1C: Hgb A1c MFr Bld: 5.4 % (ref 4.6–6.5)

## 2020-06-27 LAB — POCT URINALYSIS DIPSTICK
Bilirubin, UA: NEGATIVE
Glucose, UA: NEGATIVE
Ketones, UA: NEGATIVE
Leukocytes, UA: NEGATIVE
Nitrite, UA: NEGATIVE
Protein, UA: NEGATIVE
Spec Grav, UA: 1.025 (ref 1.010–1.025)
Urobilinogen, UA: NEGATIVE E.U./dL — AB
pH, UA: 6 (ref 5.0–8.0)

## 2020-06-27 LAB — T4, FREE: Free T4: 0.91 ng/dL (ref 0.60–1.60)

## 2020-06-27 LAB — TSH: TSH: 1.49 u[IU]/mL (ref 0.35–4.50)

## 2020-06-27 MED ORDER — NORETHINDRONE 0.35 MG PO TABS: 1.0000 | ORAL_TABLET | Freq: Every day | ORAL | 1 refills | Status: AC

## 2020-06-27 MED ORDER — TACROLIMUS 1 MG PO CAPS: 4.0000 mg | ORAL_CAPSULE | Freq: Two times a day (BID) | ORAL | 1 refills | Status: DC

## 2020-06-27 NOTE — Patient Instructions (Signed)
Preventive Care 21-27 Years Old, Female Preventive care refers to lifestyle choices and visits with your health care provider that can promote health and wellness. This includes:  A yearly physical exam. This is also called an annual wellness visit.  Regular dental and eye exams.  Immunizations.  Screening for certain conditions.  Healthy lifestyle choices, such as: ? Eating a healthy diet. ? Getting regular exercise. ? Not using drugs or products that contain nicotine and tobacco. ? Limiting alcohol use. What can I expect for my preventive care visit? Physical exam Your health care provider may check your:  Height and weight. These may be used to calculate your BMI (body mass index). BMI is a measurement that tells if you are at a healthy weight.  Heart rate and blood pressure.  Body temperature.  Skin for abnormal spots. Counseling Your health care provider may ask you questions about your:  Past medical problems.  Family's medical history.  Alcohol, tobacco, and drug use.  Emotional well-being.  Home life and relationship well-being.  Sexual activity.  Diet, exercise, and sleep habits.  Work and work environment.  Access to firearms.  Method of birth control.  Menstrual cycle.  Pregnancy history. What immunizations do I need? Vaccines are usually given at various ages, according to a schedule. Your health care provider will recommend vaccines for you based on your age, medical history, and lifestyle or other factors, such as travel or where you work.   What tests do I need? Blood tests  Lipid and cholesterol levels. These may be checked every 5 years starting at age 20.  Hepatitis C test.  Hepatitis B test. Screening  Diabetes screening. This is done by checking your blood sugar (glucose) after you have not eaten for a while (fasting).  STD (sexually transmitted disease) testing, if you are at risk.  BRCA-related cancer screening. This may be  done if you have a family history of breast, ovarian, tubal, or peritoneal cancers.  Pelvic exam and Pap test. This may be done every 3 years starting at age 21. Starting at age 30, this may be done every 5 years if you have a Pap test in combination with an HPV test. Talk with your health care provider about your test results, treatment options, and if necessary, the need for more tests.   Follow these instructions at home: Eating and drinking  Eat a healthy diet that includes fresh fruits and vegetables, whole grains, lean protein, and low-fat dairy products.  Take vitamin and mineral supplements as recommended by your health care provider.  Do not drink alcohol if: ? Your health care provider tells you not to drink. ? You are pregnant, may be pregnant, or are planning to become pregnant.  If you drink alcohol: ? Limit how much you have to 0-1 drink a day. ? Be aware of how much alcohol is in your drink. In the [place] one drink equals one 12 oz bottle of beer (355 mL), one 5 oz glass of wine (148 mL), or one 1 oz glass of hard liquor (44 mL).   Lifestyle  Take daily care of your teeth and gums. Brush your teeth every morning and night with fluoride toothpaste. Floss one time each day.  Stay active. Exercise for at least 30 minutes 5 or more days each week.  Do not use any products that contain nicotine or tobacco, such as cigarettes, e-cigarettes, and chewing tobacco. If you need help quitting, ask your health care provider.  Do not   use drugs.  If you are sexually active, practice safe sex. Use a condom or other form of protection to prevent STIs (sexually transmitted infections).  If you do not wish to become pregnant, use a form of birth control. If you plan to become pregnant, see your health care provider for a prepregnancy visit.  Find healthy ways to cope with stress, such as: ? Meditation, yoga, or listening to music. ? Journaling. ? Talking to a trusted  person. ? Spending time with friends and family. Safety  Always wear your seat belt while driving or riding in a vehicle.  Do not drive: ? If you have been drinking alcohol. Do not ride with someone who has been drinking. ? When you are tired or distracted. ? While texting.  Wear a helmet and other protective equipment during sports activities.  If you have firearms in your house, make sure you follow all gun safety procedures.  Seek help if you have been physically or sexually abused. What's next?  Go to your health care provider once a year for an annual wellness visit.  Ask your health care provider how often you should have your eyes and teeth checked.  Stay up to date on all vaccines. This information is not intended to replace advice given to you by your health care provider. Make sure you discuss any questions you have with your health care provider. Document Revised: 10/22/2019 Document Reviewed: 11/04/2017 Elsevier Patient Education  2021 Elsevier Inc.  

## 2020-06-27 NOTE — Progress Notes (Addendum)
Subjective:     Wendy King is a 25 y.o. female with pmh sig for lupus nephritis s/p living donor txp 2020, AVN b/l hips s/p b/l THR in 2019, anxiety, PTSD, HTN 2/2 renal dz who iis here for a comprehensive physical exam.  Patient states she is doing well overall.  Had an appointment with rheumatology at [facility].  States it was recommended she start Plaquenil.  Patient hesitant about this as she has been fairly stable and is hoping to conceive.  Patient has an appointment next week with Dr. Lucas Mallow, at [facility] in regards to becoming pregnant.  Having vivid dreams and nightmares.  States has been ongoing since the death of her grandmother.  Patient has done counseling in the past and has considered restarting.  Patient plans to move to [facility] at the end of the month to be closer to her boyfriend.  Pt originally from [place].  She is requesting refills on several medicines including progesterone only OCPs and Prograft 4 mg twice daily.  Patient also started school since last OFV.  Social History   Socioeconomic History  . Marital status: Single    Spouse name: Not on file  . Number of children: Not on file  . Years of education: Not on file  . Highest education level: Not on file  Occupational History  . Not on file  Tobacco Use  . Smoking status: Never Smoker  . Smokeless tobacco: Never Used  Vaping Use  . Vaping Use: Never used  Substance and Sexual Activity  . Alcohol use: Yes    Comment: OCCASSIONAL  . Drug use: Never  . Sexual activity: Yes    Birth control/protection: Condom    Comment: 1st intercourse 25 yo-More than 5 partners  Other Topics Concern  . Not on file  Social History Narrative  . Not on file   Social Determinants of Health   Financial Resource Strain: Low Risk   . Difficulty of Paying Living Expenses: Not hard at all  Food Insecurity: No Food Insecurity  . Worried About Programme researcher, broadcasting/film/video in the Last Year: Never true  . Ran Out of Food in the Last Year:  Never true  Transportation Needs: No Transportation Needs  . Lack of Transportation (Medical): No  . Lack of Transportation (Non-Medical): No  Physical Activity: Insufficiently Active  . Days of Exercise per Week: 4 days  . Minutes of Exercise per Session: 30 min  Stress: No Stress Concern Present  . Feeling of Stress : Not at all  Social Connections: Moderately Isolated  . Frequency of Communication with Friends and Family: More than three times a week  . Frequency of Social Gatherings with Friends and Family: More than three times a week  . Attends Religious Services: Never  . Active Member of Clubs or Organizations: Yes  . Attends Banker Meetings: Never  . Marital Status: Never married  Intimate Partner Violence: Not At Risk  . Fear of Current or Ex-Partner: No  . Emotionally Abused: No  . Physically Abused: No  . Sexually Abused: No   Health Maintenance  Topic Date Due  . TETANUS/TDAP  Never done  . COVID-19 Vaccine (4 - Booster for Pfizer series) 05/30/2020  . INFLUENZA VACCINE  10/07/2020  . PAP-Cervical Cytology Screening  04/18/2022  . PAP SMEAR-Modifier  04/18/2022  . Hepatitis C Screening  Completed  . HIV Screening  Completed  . HPV VACCINES  Aged Out    The following portions  of the patient's history were reviewed and updated as appropriate: allergies, current medications, past family history, past medical history, past social history, past surgical history and problem list.  Review of Systems Pertinent items noted in HPI and remainder of comprehensive ROS otherwise negative.   Objective:    BP (!) 128/96 (BP Location: Left Arm, Patient Position: Sitting, Cuff Size: Normal)   Pulse 79   Temp 98.4 F (36.9 C) (Oral)   Wt 202 lb 3.2 oz (91.7 kg)   LMP 06/25/2020   SpO2 99%   BMI 35.82 kg/m  General appearance: alert, cooperative and no distress Head: Normocephalic, without obvious abnormality, atraumatic Eyes: conjunctivae/corneas clear.  PERRL, EOM's intact. Fundi benign. Ears: normal TM's and external ear canals both ears Nose: Nares normal. Septum midline. Mucosa normal. No drainage or sinus tenderness. Throat: lips, mucosa, and tongue normal; teeth and gums normal Neck: no adenopathy, no carotid bruit, no JVD, supple, symmetrical, trachea midline and thyroid not enlarged, symmetric, no tenderness/mass/nodules Lungs: clear to auscultation bilaterally Heart: regular rate and rhythm, S1, S2 normal, no murmur, click, rub or gallop Abdomen: soft, non-tender; bowel sounds normal; no masses,  no organomegaly Extremities: extremities normal, atraumatic, no cyanosis or edema Pulses: 2+ and symmetric Skin: Skin color, texture, turgor normal. No rashes or lesions Lymph nodes: Cervical, supraclavicular, and axillary nodes normal. Neurologic: Alert and oriented X 3, normal strength and tone. Normal symmetric reflexes. Normal coordination and gait    Assessment:    Healthy female exam.      Plan:     Anticipatory guidance given including wearing seatbelts, smoke detectors in the home, increasing physical activity, increasing p.o. intake of water and vegetables. -will obtain labs -pap up to date, done 04/19/19.   -given handout See After Visit Summary for Counseling Recommendations    Systemic lupus erythematosus with glomerular disease, unspecified SLE type (HCC) -Continue prednisone 5 mg daily -Continue follow-up with nephrology Dr. Kathe Mariner at [facility], rheumatology, transplant team at [facility] health - Plan: CBC with Differential/Platelet, Hemoglobin A1c, Vitamin D, 25-hydroxy  Hypertension secondary to other renal disorders  -BP elevated this visit -Patient encouraged to check BP regularly and keep a log to bring with her visits - Plan: Hemoglobin A1c, Lipid panel, CMP, Vitamin D, 25-hydroxy  Renal transplant, status post -Living donor transplant -Stable -BP mildly elevated this visit continue  to monitor closely -Continue current medications including Prograf 4 mg twice daily, CellCept 500 mg twice daily, and prednisone 5 mg daily -Discussed the importance of contraception given use of CellCept - Plan: CBC with Differential/Platelet, Hemoglobin A1c, CMP, Vitamin D, 25-hydroxy, POCT urinalysis dipstick, tacrolimus (PROGRAF) 1 MG capsule  Encounter for surveillance of contraceptive pills -Patient interested in becoming pregnant -Encouraged to keep appointment with Dr. Dawayne Cirri at [facility] next week in regards to lupus in pregnancy -Discussed the importance of contraception use while taking CellCept 2/2 teratogenic risks - Plan: norethindrone (ORTHO MICRONOR) 0.35 MG tablet  35 minutes spent with this patient reviewing chart/addressing chronic medical problems in addition to CPE which was performed on the same date.   Abbe Amsterdam, MD

## 2020-07-05 ENCOUNTER — Other Ambulatory Visit: Payer: Self-pay | Admitting: Family Medicine

## 2020-07-05 MED ORDER — VITAMIN D (ERGOCALCIFEROL) 1.25 MG (50000 UNIT) PO CAPS: 50000.0000 [IU] | ORAL_CAPSULE | ORAL | 0 refills | Status: AC

## 2020-07-08 NOTE — Progress Notes (Signed)
ATC patient no answer, per FYI left detailed vm.

## 2020-07-23 ENCOUNTER — Other Ambulatory Visit: Payer: Self-pay | Admitting: Family Medicine

## 2020-07-23 DIAGNOSIS — Z940 Kidney transplant status: Secondary | ICD-10-CM

## 2020-08-20 ENCOUNTER — Telehealth: Payer: Self-pay | Admitting: Family Medicine

## 2020-08-20 NOTE — Telephone Encounter (Signed)
Patient called asking if Dr. Salomon Fick can send her tacrolimus (prograf) 1mg  capsule to her pharmacy in where she lives now.  [facility] due to [facility] have medicare.   Please send to: [id number] [place], [place] - [address] AT [facility] [address]

## 2020-08-28 ENCOUNTER — Other Ambulatory Visit: Payer: Self-pay | Admitting: Family Medicine

## 2020-08-28 DIAGNOSIS — Z940 Kidney transplant status: Secondary | ICD-10-CM

## 2020-08-28 MED ORDER — TACROLIMUS 1 MG PO CAPS: 4.0000 mg | ORAL_CAPSULE | Freq: Two times a day (BID) | ORAL | 0 refills | Status: DC

## 2020-11-18 ENCOUNTER — Telehealth: Payer: Self-pay | Admitting: Family Medicine

## 2020-11-18 ENCOUNTER — Other Ambulatory Visit: Payer: Self-pay | Admitting: Family Medicine

## 2020-11-18 NOTE — Telephone Encounter (Signed)
PT called to request a refill of their predniSONE (DELTASONE) 5 MG tablet as they are currently out of it. Please call it into the [id number] - [place], [place] - [address] AT [facility] [address].

## 2020-11-20 ENCOUNTER — Other Ambulatory Visit: Payer: Self-pay

## 2020-11-20 MED ORDER — PREDNISONE 5 MG PO TABS: 5.0000 mg | ORAL_TABLET | Freq: Every day | ORAL | 0 refills | Status: AC

## 2020-11-21 NOTE — Telephone Encounter (Signed)
Refill already sent.

## 2020-12-16 ENCOUNTER — Other Ambulatory Visit: Payer: Self-pay | Admitting: Family Medicine

## 2020-12-16 DIAGNOSIS — Z940 Kidney transplant status: Secondary | ICD-10-CM

## 2021-01-23 ENCOUNTER — Other Ambulatory Visit: Payer: Self-pay | Admitting: Family Medicine

## 2021-01-23 DIAGNOSIS — Z940 Kidney transplant status: Secondary | ICD-10-CM

## 2021-02-05 ENCOUNTER — Telehealth: Payer: Self-pay | Admitting: Family Medicine

## 2021-02-05 NOTE — Telephone Encounter (Signed)
Left message for patient to call back and schedule Medicare Annual Wellness Visit (AWV) either virtually or in office. Left  my Zachery Conch number [phone]   Last AWV ;02/16/20 please schedule at anytime with [facility] Nurse Health Advisor 1 or 2   This should be a 45 minute visit.

## 2021-02-22 ENCOUNTER — Other Ambulatory Visit: Payer: Self-pay | Admitting: Family Medicine

## 2021-02-22 DIAGNOSIS — Z3041 Encounter for surveillance of contraceptive pills: Secondary | ICD-10-CM

## 2021-02-26 ENCOUNTER — Other Ambulatory Visit: Payer: Self-pay | Admitting: Family Medicine

## 2021-02-26 DIAGNOSIS — Z940 Kidney transplant status: Secondary | ICD-10-CM

## 2021-03-25 ENCOUNTER — Telehealth: Payer: Self-pay | Admitting: Family Medicine

## 2021-03-25 NOTE — Telephone Encounter (Signed)
Left message for patient to call back and schedule Medicare Annual Wellness Visit (AWV) either virtually or in office. Left  my jabber number [phone]   Last AWV ;02/16/20 please schedule at anytime with [facility] Nurse Health Advisor 1 or 2   This should be a 45 minute visit.  

## 2021-04-18 ENCOUNTER — Other Ambulatory Visit: Payer: Self-pay | Admitting: Family Medicine

## 2021-04-18 DIAGNOSIS — Z3041 Encounter for surveillance of contraceptive pills: Secondary | ICD-10-CM

## 2021-04-18 NOTE — Telephone Encounter (Signed)
LVM instructions for pt to call back to confirm if taking medication. Pt also needs to schedule CPE for 06/27/21 or after.

## 2021-04-23 NOTE — Telephone Encounter (Signed)
ATC,voicemail is full °

## 2021-05-02 ENCOUNTER — Telehealth: Payer: Self-pay | Admitting: Family Medicine

## 2021-05-02 NOTE — Telephone Encounter (Signed)
Left message for patient to call back and schedule Medicare Annual Wellness Visit (AWV) either virtually or in office. Left  my jabber number [phone]   Last AWV ;02/16/20 please schedule at anytime with [facility] Nurse Health Advisor 1 or 2   This should be a 45 minute visit.  

## 2021-06-12 ENCOUNTER — Telehealth: Payer: Self-pay | Admitting: Family Medicine

## 2021-06-12 NOTE — Telephone Encounter (Signed)
Left message for patient to call back and schedule Medicare Annual Wellness Visit (AWV) either virtually or in office. Left  my jabber number [phone]   Last AWV ;02/16/20  please schedule at anytime with [facility] Nurse Health Advisor 1 or 2  .  

## 2021-06-22 ENCOUNTER — Other Ambulatory Visit: Payer: Self-pay | Admitting: Family Medicine

## 2021-06-22 DIAGNOSIS — Z940 Kidney transplant status: Secondary | ICD-10-CM

## 2021-07-11 IMAGING — US US PELVIS COMPLETE WITH TRANSVAGINAL
1 series · 14 of 25 positions shown · non-contrast
Comparison: None

CLINICAL DATA: Abnormal uterine bleeding, history of renal
transplant, hypertension, lupus, asthma

EXAM:
TRANSABDOMINAL AND TRANSVAGINAL ULTRASOUND OF PELVIS
TECHNIQUE: Both transabdominal and transvaginal ultrasound examinations of the
pelvis were performed. Transabdominal technique was performed for
global imaging of the pelvis including uterus, ovaries, adnexal
regions, and pelvic cul-de-sac. It was necessary to proceed with
endovaginal exam following the transabdominal exam to visualize the
endometrium and RIGHT ovary.

[Series 1: us pelvis complete with transvaginal · 0.22mm/px · 65 acquisitions, 14 frames shown]
[im 1/65]
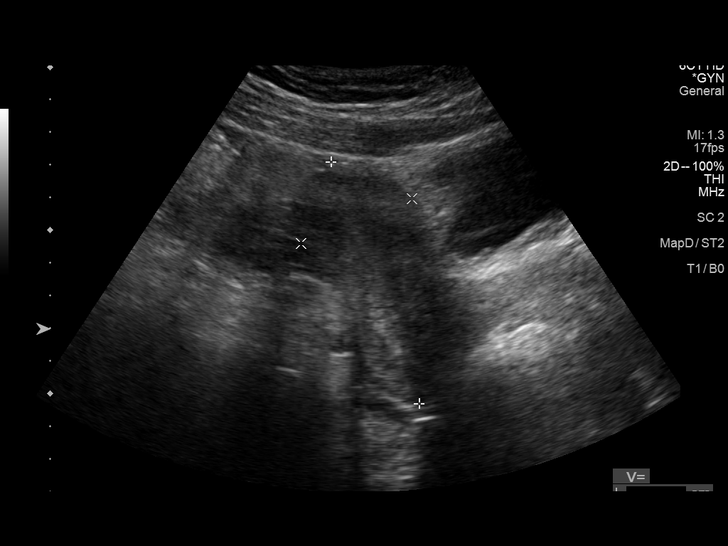
[im 6/65]
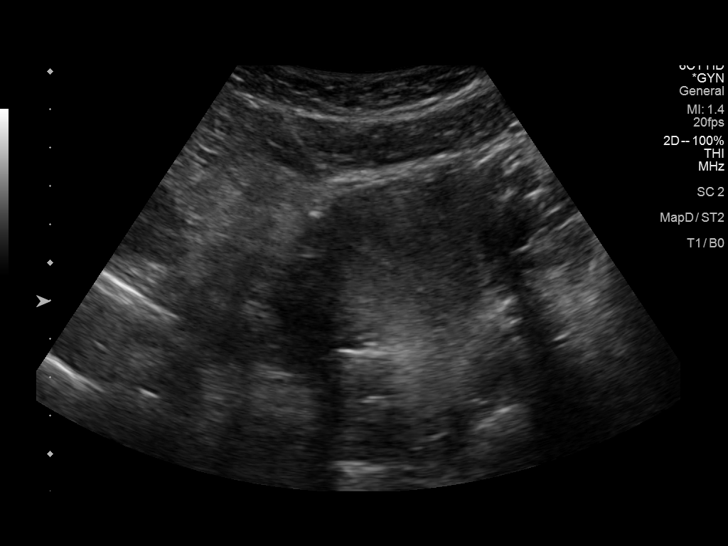
[im 11/65]
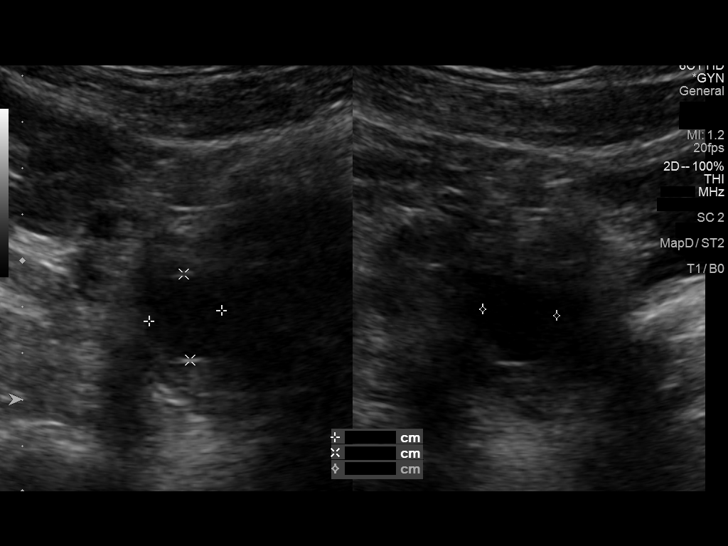
[im 17/65]
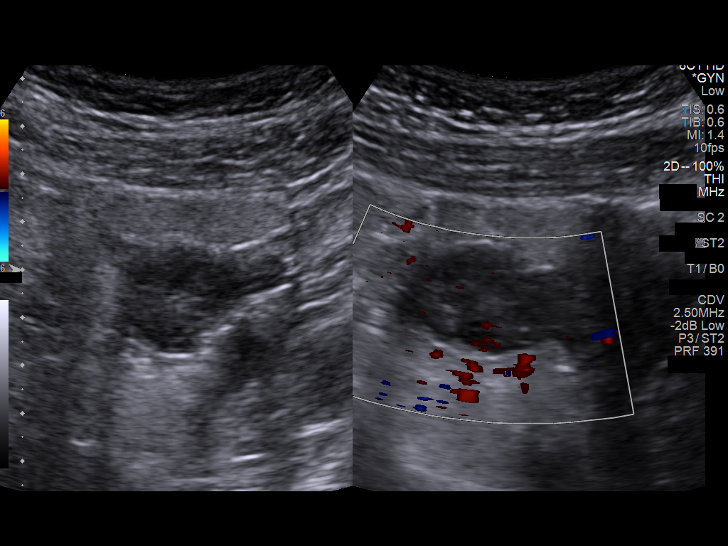
[im 22/65]
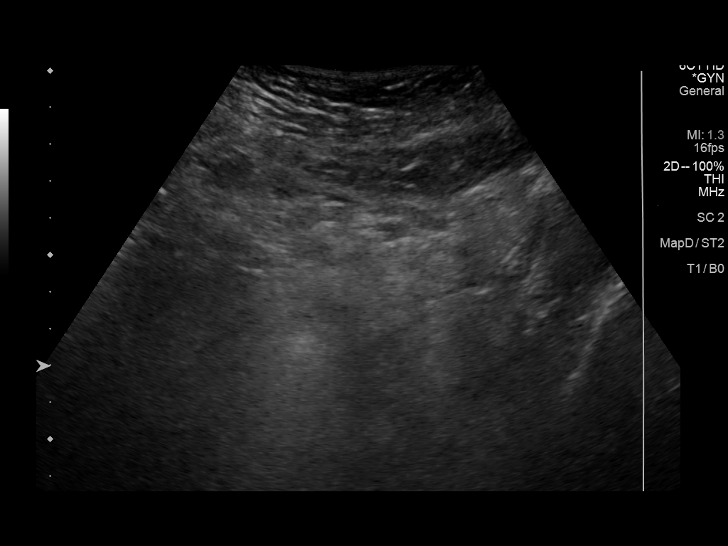
[im 25/65]
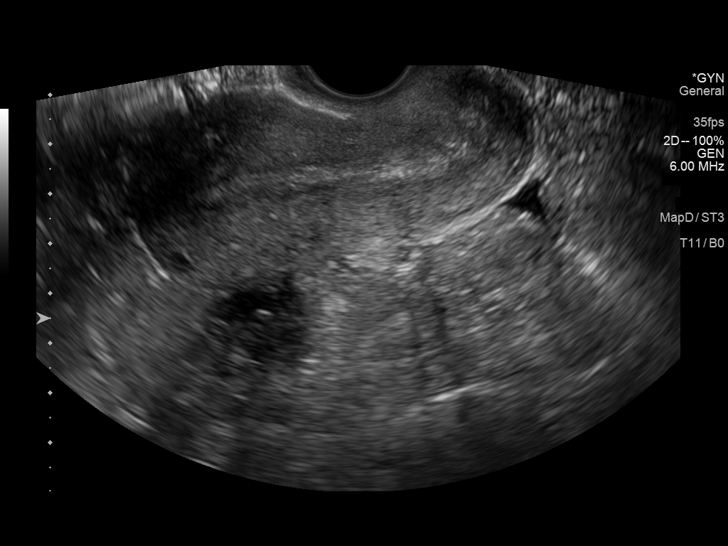
[im 30/65]
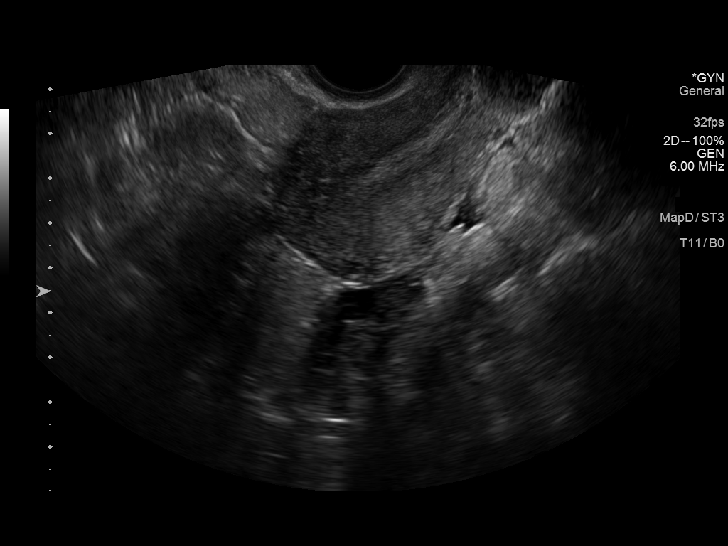
[im 35/65]
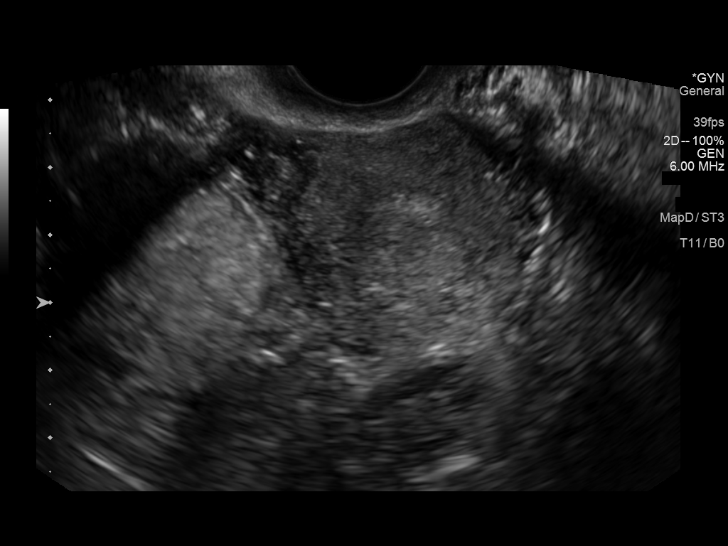
[im 41/65]
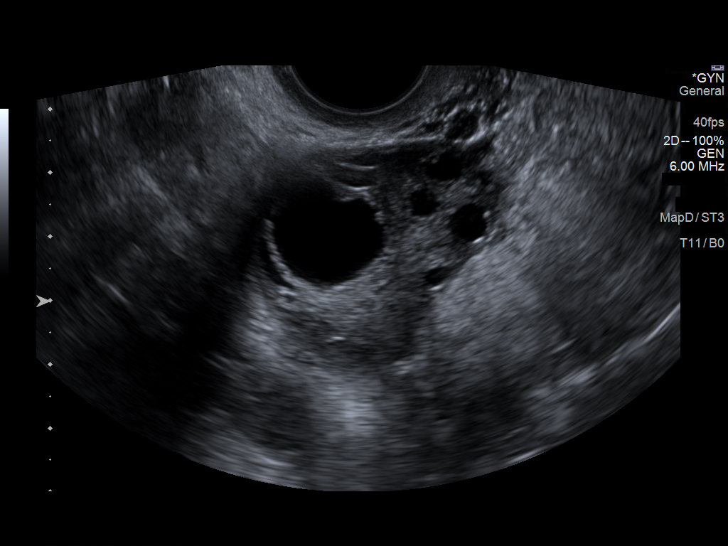
[im 43/65]
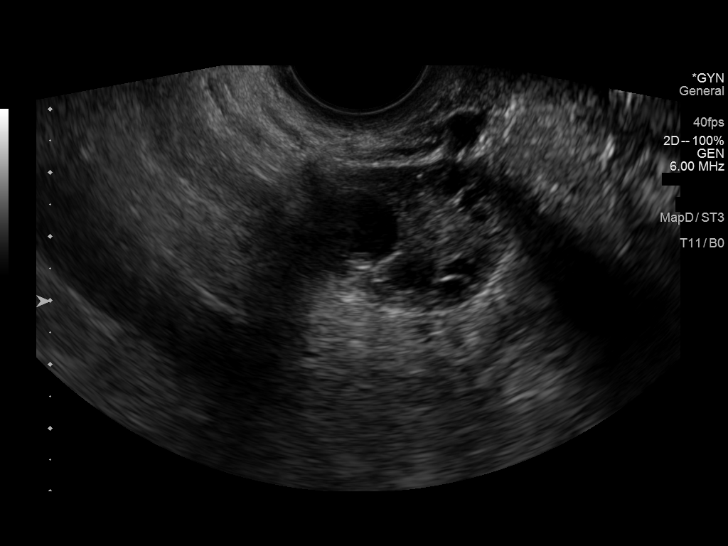
[im 49/65]
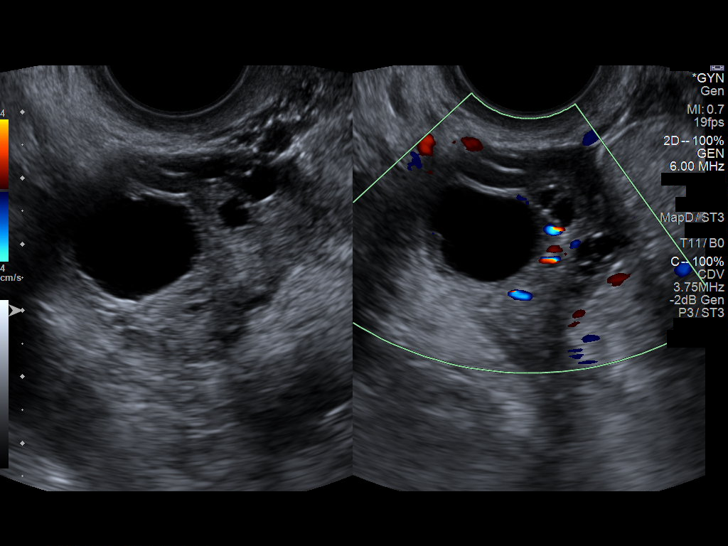
[im 54/65]
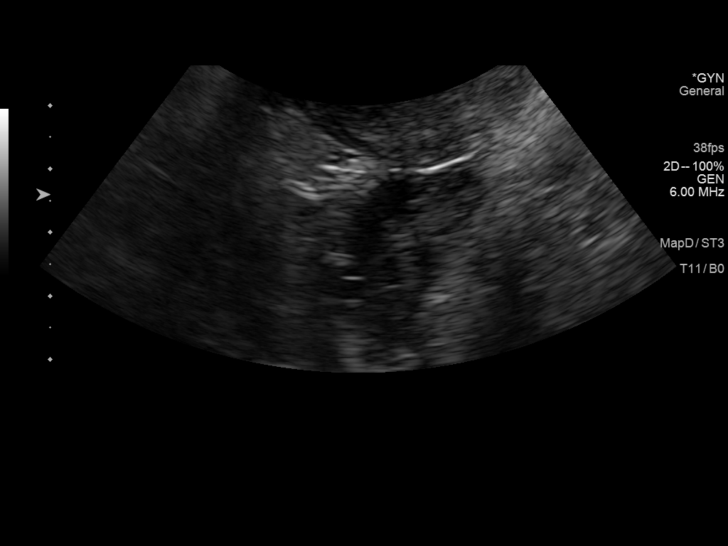
[im 59/65]
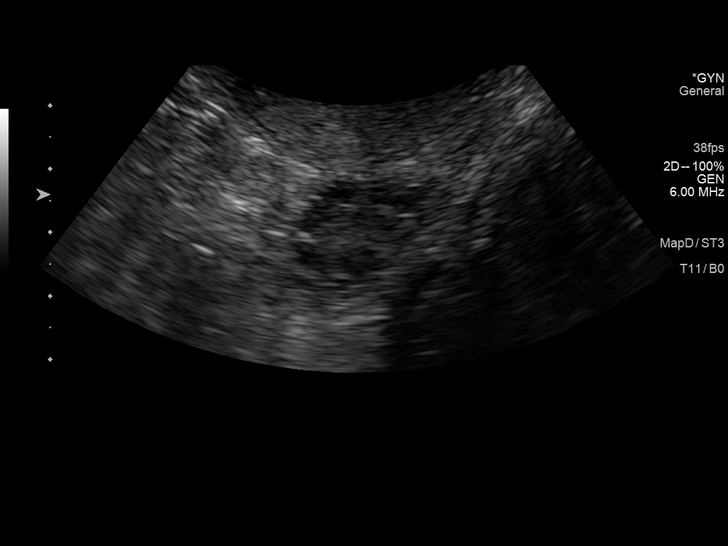
[im 65/65]
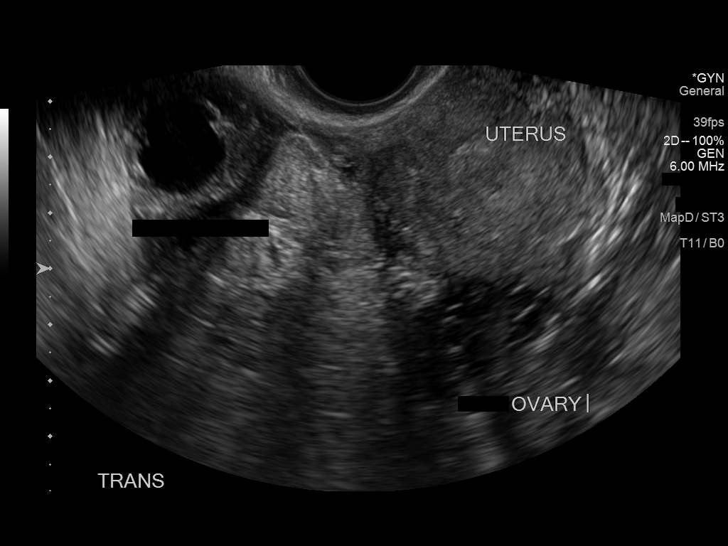

[14 of 25 positions shown; findings below may reference images not displayed]

FINDINGS: Uterus

Measurements: 7.8 x 3.7 x 4.9 cm = volume: 73 mL. Anteverted. Mildly
heterogeneous myometrium. No focal mass.

Endometrium

Thickness: 2 mm, normal.  No endometrial fluid or focal abnormality

Right ovary

Measurements: 2.8 x 3.7 x 4.2 cm = volume: 22 mL. Normal morphology
without mass

Left ovary

Measurements: 3.1 x 2.3 x 2.9 cm = volume: 11 mL. Normal morphology
without mass

Other findings

Trace free pelvic fluid, potentially physiologic. No adnexal masses.
IMPRESSION: Normal exam.

## 2021-07-25 ENCOUNTER — Other Ambulatory Visit: Payer: Self-pay | Admitting: Family Medicine

## 2021-07-25 DIAGNOSIS — F419 Anxiety disorder, unspecified: Secondary | ICD-10-CM

## 2021-07-25 DIAGNOSIS — F321 Major depressive disorder, single episode, moderate: Secondary | ICD-10-CM

## 2021-09-03 ENCOUNTER — Telehealth: Payer: Self-pay | Admitting: Family Medicine

## 2021-09-03 NOTE — Telephone Encounter (Signed)
Left message for patient to call back and schedule Medicare Annual Wellness Visit (AWV) either virtually or in office. Left  my Zachery Conch number [phone]   Last AWV ;02/16/20  please schedule at anytime with [facility] Nurse Health Advisor 1 or 2  .

## 2021-10-28 ENCOUNTER — Telehealth: Payer: Self-pay | Admitting: Family Medicine

## 2021-10-28 NOTE — Telephone Encounter (Signed)
Spoke with patient to schedule her AWV.   She stated she moved to [facility] and has a new PCP.
